# Patient Record
Sex: Female | Born: 1965
Health system: Southern US, Community
[De-identification: ages and names within clinical notes are randomized; demographics above are authoritative.]

## PROBLEM LIST (undated history)

## (undated) DIAGNOSIS — B019 Varicella without complication: Secondary | ICD-10-CM

## (undated) DIAGNOSIS — O00109 Unspecified tubal pregnancy without intrauterine pregnancy: Secondary | ICD-10-CM

## (undated) DIAGNOSIS — E079 Disorder of thyroid, unspecified: Secondary | ICD-10-CM

## (undated) DIAGNOSIS — K5909 Other constipation: Secondary | ICD-10-CM

## (undated) DIAGNOSIS — T7840XA Allergy, unspecified, initial encounter: Secondary | ICD-10-CM

## (undated) DIAGNOSIS — E042 Nontoxic multinodular goiter: Secondary | ICD-10-CM

## (undated) DIAGNOSIS — B009 Herpesviral infection, unspecified: Secondary | ICD-10-CM

## (undated) DIAGNOSIS — M359 Systemic involvement of connective tissue, unspecified: Secondary | ICD-10-CM

## (undated) DIAGNOSIS — T4145XA Adverse effect of unspecified anesthetic, initial encounter: Secondary | ICD-10-CM

## (undated) DIAGNOSIS — M1990 Unspecified osteoarthritis, unspecified site: Secondary | ICD-10-CM

## (undated) DIAGNOSIS — K635 Polyp of colon: Secondary | ICD-10-CM

## (undated) DIAGNOSIS — T8859XA Other complications of anesthesia, initial encounter: Secondary | ICD-10-CM

## (undated) DIAGNOSIS — N809 Endometriosis, unspecified: Secondary | ICD-10-CM

## (undated) DIAGNOSIS — M7500 Adhesive capsulitis of unspecified shoulder: Secondary | ICD-10-CM

## (undated) DIAGNOSIS — I341 Nonrheumatic mitral (valve) prolapse: Secondary | ICD-10-CM

## (undated) DIAGNOSIS — N6019 Diffuse cystic mastopathy of unspecified breast: Secondary | ICD-10-CM

## (undated) DIAGNOSIS — D259 Leiomyoma of uterus, unspecified: Secondary | ICD-10-CM

## (undated) DIAGNOSIS — G43909 Migraine, unspecified, not intractable, without status migrainosus: Secondary | ICD-10-CM

## (undated) DIAGNOSIS — R011 Cardiac murmur, unspecified: Secondary | ICD-10-CM

## (undated) DIAGNOSIS — N390 Urinary tract infection, site not specified: Secondary | ICD-10-CM

## (undated) DIAGNOSIS — G5600 Carpal tunnel syndrome, unspecified upper limb: Secondary | ICD-10-CM

## (undated) DIAGNOSIS — K219 Gastro-esophageal reflux disease without esophagitis: Secondary | ICD-10-CM

## (undated) HISTORY — DX: Urinary tract infection, site not specified: N39.0

## (undated) HISTORY — DX: Gastro-esophageal reflux disease without esophagitis: K21.9

## (undated) HISTORY — PX: APPENDECTOMY: SHX54

## (undated) HISTORY — DX: Varicella without complication: B01.9

## (undated) HISTORY — DX: Systemic involvement of connective tissue, unspecified: M35.9

## (undated) HISTORY — DX: Allergy, unspecified, initial encounter: T78.40XA

## (undated) HISTORY — PX: TUBAL LIGATION: SHX77

## (undated) HISTORY — DX: Endometriosis, unspecified: N80.9

## (undated) HISTORY — PX: ECTOPIC PREGNANCY SURGERY: SHX613

## (undated) HISTORY — DX: Nontoxic multinodular goiter: E04.2

## (undated) HISTORY — PX: THYROIDECTOMY: SHX17

## (undated) HISTORY — PX: TONSILLECTOMY: SUR1361

## (undated) HISTORY — PX: LAPAROSCOPIC OOPHERECTOMY: SHX6507

## (undated) HISTORY — PX: UTERINE FIBROID SURGERY: SHX826

## (undated) HISTORY — DX: Other constipation: K59.09

## (undated) HISTORY — DX: Polyp of colon: K63.5

## (undated) HISTORY — DX: Nonrheumatic mitral (valve) prolapse: I34.1

## (undated) HISTORY — DX: Carpal tunnel syndrome, unspecified upper limb: G56.00

## (undated) HISTORY — DX: Cardiac murmur, unspecified: R01.1

## (undated) HISTORY — DX: Migraine, unspecified, not intractable, without status migrainosus: G43.909

## (undated) HISTORY — DX: Diffuse cystic mastopathy of unspecified breast: N60.19

## (undated) HISTORY — DX: Leiomyoma of uterus, unspecified: D25.9

## (undated) HISTORY — DX: Adhesive capsulitis of unspecified shoulder: M75.00

## (undated) HISTORY — PX: OTHER SURGICAL HISTORY: SHX169

## (undated) HISTORY — PX: TOE SURGERY: SHX1073

## (undated) HISTORY — DX: Herpesviral infection, unspecified: B00.9

---
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
## 2007-06-07 DIAGNOSIS — K31819 Angiodysplasia of stomach and duodenum without bleeding: Secondary | ICD-10-CM | POA: Insufficient documentation

## 2007-06-07 DIAGNOSIS — D259 Leiomyoma of uterus, unspecified: Secondary | ICD-10-CM | POA: Insufficient documentation

## 2008-12-11 DIAGNOSIS — N6019 Diffuse cystic mastopathy of unspecified breast: Secondary | ICD-10-CM | POA: Insufficient documentation

## 2011-11-09 DIAGNOSIS — K2100 Gastro-esophageal reflux disease with esophagitis, without bleeding: Secondary | ICD-10-CM | POA: Insufficient documentation

## 2011-11-09 DIAGNOSIS — E890 Postprocedural hypothyroidism: Secondary | ICD-10-CM | POA: Insufficient documentation

## 2011-11-09 DIAGNOSIS — L4050 Arthropathic psoriasis, unspecified: Secondary | ICD-10-CM | POA: Insufficient documentation

## 2014-02-06 DIAGNOSIS — R002 Palpitations: Secondary | ICD-10-CM | POA: Insufficient documentation

## 2015-02-27 LAB — HM HEPATITIS C SCREENING LAB: HM HEPATITIS C SCREENING: NEGATIVE

## 2017-08-24 HISTORY — PX: ABLATION ON ENDOMETRIOSIS: SHX5787

## 2018-01-13 ENCOUNTER — Telehealth: Payer: Self-pay | Admitting: *Deleted

## 2018-01-13 NOTE — Telephone Encounter (Signed)
Copied from [place] [id number]. Topic: Inquiry >> Jan 13, 2018  1:01 PM Neva Seat wrote: New pt appt w/ Dr. Aundra Dubin July 2019. Pt is new in town and is needing her medications filled.  Pt is asking if she can have these filled.

## 2018-01-13 NOTE — Telephone Encounter (Signed)
We can not do anything until patient is established. She will need to go to an urgent care for medication.

## 2018-01-13 NOTE — Telephone Encounter (Signed)
Advised patient she need to contact PCP she has now that Dr. Aundra Dubin has no information on her at all , and cannot refill medications with out at least a background on patient and type of medications.

## 2018-01-24 ENCOUNTER — Emergency Department
Admission: EM | Admit: 2018-01-24 | Discharge: 2018-01-24 | Disposition: A | Discharge status: Home / Self Care | Payer: BLUE CROSS/BLUE SHIELD | Attending: Emergency Medicine | Admitting: Emergency Medicine

## 2018-01-24 ENCOUNTER — Encounter: Payer: Self-pay | Admitting: Emergency Medicine

## 2018-01-24 ENCOUNTER — Other Ambulatory Visit: Payer: Self-pay

## 2018-01-24 DIAGNOSIS — L02411 Cutaneous abscess of right axilla: Secondary | ICD-10-CM | POA: Diagnosis not present

## 2018-01-24 DIAGNOSIS — Z0189 Encounter for other specified special examinations: Secondary | ICD-10-CM

## 2018-01-24 DIAGNOSIS — Z79899 Other long term (current) drug therapy: Secondary | ICD-10-CM | POA: Diagnosis not present

## 2018-01-24 DIAGNOSIS — R2231 Localized swelling, mass and lump, right upper limb: Secondary | ICD-10-CM | POA: Diagnosis present

## 2018-01-24 DIAGNOSIS — Z7689 Persons encountering health services in other specified circumstances: Secondary | ICD-10-CM

## 2018-01-24 HISTORY — DX: Disorder of thyroid, unspecified: E07.9

## 2018-01-24 HISTORY — DX: Unspecified tubal pregnancy without intrauterine pregnancy: O00.109

## 2018-01-24 HISTORY — DX: Unspecified osteoarthritis, unspecified site: M19.90

## 2018-01-24 MED ORDER — LIDOCAINE HCL (PF) 1 % IJ SOLN
5.0000 mL | Freq: Once | INTRAMUSCULAR | Status: AC
  Administered 2018-01-24: 5 mL

## 2018-01-24 MED ORDER — DOXYCYCLINE HYCLATE 100 MG PO TABS
100.0000 mg | ORAL_TABLET | Freq: Once | ORAL | Status: AC
  Administered 2018-01-24: 100 mg via ORAL
  Filled 2018-01-24: qty 1

## 2018-01-24 MED ORDER — DOXYCYCLINE HYCLATE 100 MG PO TABS: 100.0000 mg | ORAL_TABLET | Freq: Two times a day (BID) | ORAL | 0 refills | Status: DC

## 2018-01-24 MED ORDER — HYDROCODONE-ACETAMINOPHEN 5-325 MG PO TABS: 1.0000 | ORAL_TABLET | Freq: Two times a day (BID) | ORAL | 0 refills | Status: AC | PRN

## 2018-01-24 MED ORDER — LIDOCAINE HCL (PF) 1 % IJ SOLN
INTRAMUSCULAR | Status: AC
  Filled 2018-01-24: qty 5

## 2018-01-24 MED ORDER — IBUPROFEN 800 MG PO TABS
800.0000 mg | ORAL_TABLET | Freq: Once | ORAL | Status: AC
  Administered 2018-01-24: 800 mg via ORAL
  Filled 2018-01-24: qty 1

## 2018-01-24 MED ORDER — LIDOCAINE HCL (PF) 1 % IJ SOLN
5.0000 mL | Freq: Once | INTRAMUSCULAR | Status: AC
  Administered 2018-01-24: 5 mL
  Filled 2018-01-24: qty 5

## 2018-01-24 NOTE — Discharge Instructions (Signed)
Keep the wound clean, dry, and covered. Apply warm compresses over the dressing to promote healing. Follow-up in 3 days for wound check and packing removal. Take the antibiotic as directed and the pain medicine as needed.

## 2018-01-24 NOTE — ED Triage Notes (Signed)
Patient states she has a "boil" under her right arm pit going on 2 weeks, worsening over past couple of days.  Denies hx of same.

## 2018-01-24 NOTE — ED Provider Notes (Signed)
[facility] Emergency Department Provider Note ____________________________________________  Time seen: 1015  I have reviewed the triage vital signs and the nursing notes.  HISTORY  Chief Complaint  Abscess  History as told to C. Amedeo Plenty, PA-S [facility]).  HPI Courtney Ward is a 52 y.o. female presents to the ED for evaluation of a 2-week complaint of worsening boil to the right armpit.  Patient describes her last 2 days however, the pain is increased significantly.  She denies any spontaneous drainage, or history of the same.  She has been applying warm compresses to the area.  She presents now with a firm area of swelling to the right armpit.  She denies any fevers, chills, or sweats.  Past Medical History:  Diagnosis Date  . Arthritis   . Thyroid disease   . Tubal ectopic pregnancy     There are no active problems to display for this patient.   Past Surgical History:  Procedure Laterality Date  . CESAREAN SECTION    . LAPAROSCOPIC OOPHERECTOMY    . THYROIDECTOMY    . TONSILLECTOMY      Prior to Admission medications   Medication Sig Start Date End Date Taking? Authorizing Provider  cetirizine-pseudoephedrine (ZYRTEC-D) 5-120 MG tablet Take 1 tablet by mouth 2 (two) times daily.   Yes [provider]  famotidine (PEPCID AC) 10 MG chewable tablet Chew 10 mg by mouth 2 (two) times daily.   Yes [provider]  levothyroxine (SYNTHROID, LEVOTHROID) 50 MCG tablet Take 50 mcg by mouth daily before breakfast.   Yes [provider]  doxycycline (VIBRA-TABS) 100 MG tablet Take 1 tablet (100 mg total) by mouth 2 (two) times daily. 01/24/18   Saima Monterroso, Dannielle Karvonen, PA-C  HYDROcodone-acetaminophen (NORCO) 5-325 MG tablet Take 1 tablet by mouth 2 (two) times daily as needed for up to 3 days. 01/24/18 01/27/18  Braxston Quinter, Dannielle Karvonen, PA-C    Allergies Sulfa antibiotics; Tramadol; and Codeine  No family history on file.  Social  History Social History   Tobacco Use  . Smoking status: Never Smoker  . Smokeless tobacco: Never Used  Substance Use Topics  . Alcohol use: Not on file  . Drug use: Not on file    Review of Systems  Constitutional: Negative for fever. Eyes: Negative for visual changes. ENT: Negative for sore throat. Cardiovascular: Negative for chest pain. Respiratory: Negative for shortness of breath. Musculoskeletal: Negative for back pain. Skin: Negative for rash. Right axilla abscess.  Neurological: Negative for headaches, focal weakness or numbness. ____________________________________________  PHYSICAL EXAM:  VITAL SIGNS: ED Triage Vitals  Enc Vitals Group     BP 01/24/18 0901 128/83     Pulse Rate 01/24/18 0901 76     Resp 01/24/18 0901 16     Temp 01/24/18 0901 98.3 F (36.8 C)     Temp Source 01/24/18 0901 Oral     SpO2 01/24/18 0901 100 %     Weight 01/24/18 0904 165 lb (74.8 kg)     Height 01/24/18 0904 5\' 4"  (1.626 m)     Head Circumference --      Peak Flow --      Pain Score 01/24/18 0903 8     Pain Loc --      Pain Edu? --      Excl. in [place]? --     Constitutional: Alert and oriented. Well appearing and in no distress. Head: Normocephalic and atraumatic. Eyes: Conjunctivae are normal. Normal extraocular  movements Cardiovascular: Normal rate, regular rhythm. Normal distal pulses. Respiratory: Normal respiratory effort. No wheezes/rales/rhonchi. Musculoskeletal: Nontender with normal range of motion in all extremities.  Neurologic:  Normal gait without ataxia. Normal speech and language. No gross focal neurologic deficits are appreciated. Skin:  Skin is warm, dry and intact. No rash noted.  Right axilla with a firm, pointing abscess formation.  There is no spontaneous drainage or surrounding erythema. ____________________________________________  PROCEDURES  Doxycycline 100 mg PO IBU 800 mg PO  .Marland KitchenIncision and Drainage Date/Time: 01/25/2018 7:02 PM Performed by:  Jeralyn Ruths, Student-PA Authorized by: Melvenia Needles, PA-C   Consent:    Consent obtained:  Verbal   Consent given by:  Patient   Risks discussed:  Incomplete drainage, bleeding and pain Location:    Type:  Abscess   Location:  Upper extremity (right axilla) Pre-procedure details:    Skin preparation:  Betadine Anesthesia (see MAR for exact dosages):    Anesthesia method:  Local infiltration   Local anesthetic:  Lidocaine 1% w/o epi Procedure type:    Complexity:  Simple Procedure details:    Needle aspiration: no     Incision types:  Single straight   Incision depth:  Subcutaneous   Scalpel blade:  11   Wound management:  Probed and deloculated and irrigated with saline   Drainage:  Purulent   Drainage amount:  Moderate   Wound treatment:  Drain placed   Packing materials:  1/4 in iodoform gauze   Amount 1/4" iodoform:  4 cm Post-procedure details:    Patient tolerance of procedure:  Tolerated well, no immediate complications  ____________________________________________  INITIAL IMPRESSION / ASSESSMENT AND PLAN / ED COURSE  Management of a right axilla abscess, status post I&D procedure.  Tolerates procedure well and a moderate amount of purulent material is expressed from the abscess.  Wound is appropriately flushed, packed, and dressed.  Patient is discharged with prescription for doxycycline as well as hydrocodone to take as needed.  She will return to the ED in 3 days for wound check and packing removal.  Work note is provided as requested. ____________________________________________  FINAL CLINICAL IMPRESSION(S) / ED DIAGNOSES  Final diagnoses:  Abscess of axilla, right  Encounter for incision and drainage procedure      Melvenia Needles, PA-C 01/25/18 1905    Lavonia Drafts, MD 01/27/18 1801

## 2018-01-26 ENCOUNTER — Other Ambulatory Visit: Payer: Self-pay

## 2018-01-26 ENCOUNTER — Emergency Department
Admission: EM | Admit: 2018-01-26 | Discharge: 2018-01-26 | Disposition: A | Discharge status: Home / Self Care | Payer: BLUE CROSS/BLUE SHIELD | Attending: Emergency Medicine | Admitting: Emergency Medicine

## 2018-01-26 ENCOUNTER — Encounter: Payer: Self-pay | Admitting: Emergency Medicine

## 2018-01-26 DIAGNOSIS — Z79899 Other long term (current) drug therapy: Secondary | ICD-10-CM | POA: Insufficient documentation

## 2018-01-26 DIAGNOSIS — Z09 Encounter for follow-up examination after completed treatment for conditions other than malignant neoplasm: Secondary | ICD-10-CM

## 2018-01-26 NOTE — Discharge Instructions (Addendum)
Keep the wound clean, dry, and covered. Apply warm compresses to promote healing. Continue to dose the antibiotic as prescribed. Follow-up with your primary provider or Dermatology as discussed.

## 2018-01-26 NOTE — ED Notes (Signed)
Says here fro recheck of wound under left arm.  Dressing in place.  Says it iseems like it is bigger than before.  In nad.

## 2018-01-26 NOTE — ED Triage Notes (Signed)
Had abscess lanced on Monday-here for recheck.

## 2018-01-26 NOTE — ED Provider Notes (Signed)
[facility] Emergency Department Provider Note ____________________________________________  Time seen: 1012  I have reviewed the triage vital signs and the nursing notes.  HISTORY  Chief Complaint  Wound Check  HPI Courtney Ward is a 52 y.o. female returns to the ED for wound check and packing removal.  Patient denies any interim complaints and she has been tolerating the antibiotic as prescribed.  She is concerned that she had some adverse effects from the pain medicine as suspected.  She is not taking any pain medicine in more than 24 hours.  Reports improvement of the pain and tenderness to her right axilla abscess.  Past Medical History:  Diagnosis Date  . Arthritis   . Thyroid disease   . Tubal ectopic pregnancy     There are no active problems to display for this patient.  Past Surgical History:  Procedure Laterality Date  . CESAREAN SECTION    . LAPAROSCOPIC OOPHERECTOMY    . THYROIDECTOMY    . TONSILLECTOMY      Prior to Admission medications   Medication Sig Start Date End Date Taking? Authorizing Provider  cetirizine-pseudoephedrine (ZYRTEC-D) 5-120 MG tablet Take 1 tablet by mouth 2 (two) times daily.    [provider]  doxycycline (VIBRA-TABS) 100 MG tablet Take 1 tablet (100 mg total) by mouth 2 (two) times daily. 01/24/18   Anina Schnake, Dannielle Karvonen, PA-C  famotidine (PEPCID AC) 10 MG chewable tablet Chew 10 mg by mouth 2 (two) times daily.    [provider]  HYDROcodone-acetaminophen (NORCO) 5-325 MG tablet Take 1 tablet by mouth 2 (two) times daily as needed for up to 3 days. 01/24/18 01/27/18  Taronda Comacho, Dannielle Karvonen, PA-C  levothyroxine (SYNTHROID, LEVOTHROID) 50 MCG tablet Take 50 mcg by mouth daily before breakfast.    [provider]    Allergies Sulfa antibiotics; Tramadol; and Codeine  No family history on file.  Social History Social History   Tobacco Use  . Smoking status: Never Smoker  .  Smokeless tobacco: Never Used  Substance Use Topics  . Alcohol use: Not on file  . Drug use: Not on file    Review of Systems  Constitutional: Negative for fever. Cardiovascular: Negative for chest pain. Respiratory: Negative for shortness of breath. Musculoskeletal: Negative for back pain. Skin: Negative for rash.  Healing right axilla abscess as above. Neurological: Negative for headaches, focal weakness or numbness. ____________________________________________  PHYSICAL EXAM:  VITAL SIGNS: ED Triage Vitals  Enc Vitals Group     BP 01/26/18 0954 (!) 152/78     Pulse Rate 01/26/18 0954 76     Resp 01/26/18 0954 18     Temp 01/26/18 0954 98.6 F (37 C)     Temp Source 01/26/18 0954 Oral     SpO2 01/26/18 0954 98 %     Weight 01/26/18 0954 165 lb (74.8 kg)     Height 01/26/18 0954 5\' 4"  (1.626 m)     Head Circumference --      Peak Flow --      Pain Score 01/26/18 1000 5     Pain Loc --      Pain Edu? --      Excl. in [place]? --     Constitutional: Alert and oriented. Well appearing and in no distress. Head: Normocephalic and atraumatic. Cardiovascular: Normal rate, regular rhythm. Normal distal pulses. Respiratory: Normal respiratory effort.  Musculoskeletal: Nontender with normal range of motion in all extremities.  Neurologic:  Normal  gait without ataxia. Normal speech and language. No gross focal neurologic deficits are appreciated. Skin:  Skin is warm, dry and intact. No rash noted.  A well-healing right axilla abscess is noted.  The wound packing is removed.  The wound flushes clear after saline flush.  There remains some induration inferiorly to the incision site. ____________________________________________  PROCEDURES  Procedures  Wet-to-dry 2 x 2 gauze dressing Paper tape applied ____________________________________________  INITIAL IMPRESSION / ASSESSMENT AND PLAN / ED COURSE  Patient with ED evaluation wound check of a right axilla abscess.  Wound  appears to be healing well.  Patient has the packing removed and a dressing is applied.  She is encouraged to continue antibiotics as prescribed.  She is also encouraged to apply warm compresses to promote healing.  A referral for dermatology is also provided for interim wound check.  A work note is provided for today as requested. ____________________________________________  FINAL CLINICAL IMPRESSION(S) / ED DIAGNOSES  Final diagnoses:  Encounter for recheck of abscess following incision and drainage      Janaisha Tolsma, Dannielle Karvonen, PA-C 01/26/18 [place]    Earleen Newport, MD 01/26/18 1157

## 2018-03-08 ENCOUNTER — Ambulatory Visit: Payer: BLUE CROSS/BLUE SHIELD | Admitting: Internal Medicine

## 2018-03-08 ENCOUNTER — Other Ambulatory Visit (HOSPITAL_COMMUNITY)
Admission: RE | Admit: 2018-03-08 | Discharge: 2018-03-08 | Disposition: A | Discharge status: Home / Self Care | Payer: BLUE CROSS/BLUE SHIELD | Source: Ambulatory Visit | Attending: Internal Medicine | Admitting: Internal Medicine

## 2018-03-08 ENCOUNTER — Other Ambulatory Visit: Payer: Self-pay | Admitting: Internal Medicine

## 2018-03-08 VITALS — BP 124/58 | HR 71 | Temp 98.5°F | Ht 64.0 in | Wt 163.0 lb

## 2018-03-08 DIAGNOSIS — E611 Iron deficiency: Secondary | ICD-10-CM | POA: Insufficient documentation

## 2018-03-08 DIAGNOSIS — K219 Gastro-esophageal reflux disease without esophagitis: Secondary | ICD-10-CM

## 2018-03-08 DIAGNOSIS — Z0184 Encounter for antibody response examination: Secondary | ICD-10-CM

## 2018-03-08 DIAGNOSIS — I341 Nonrheumatic mitral (valve) prolapse: Secondary | ICD-10-CM | POA: Insufficient documentation

## 2018-03-08 DIAGNOSIS — R7303 Prediabetes: Secondary | ICD-10-CM

## 2018-03-08 DIAGNOSIS — Z1159 Encounter for screening for other viral diseases: Secondary | ICD-10-CM

## 2018-03-08 DIAGNOSIS — E890 Postprocedural hypothyroidism: Secondary | ICD-10-CM | POA: Insufficient documentation

## 2018-03-08 DIAGNOSIS — E039 Hypothyroidism, unspecified: Secondary | ICD-10-CM | POA: Insufficient documentation

## 2018-03-08 DIAGNOSIS — R2231 Localized swelling, mass and lump, right upper limb: Secondary | ICD-10-CM

## 2018-03-08 DIAGNOSIS — Z113 Encounter for screening for infections with a predominantly sexual mode of transmission: Secondary | ICD-10-CM | POA: Diagnosis not present

## 2018-03-08 DIAGNOSIS — Z8669 Personal history of other diseases of the nervous system and sense organs: Secondary | ICD-10-CM | POA: Insufficient documentation

## 2018-03-08 DIAGNOSIS — R739 Hyperglycemia, unspecified: Secondary | ICD-10-CM

## 2018-03-08 DIAGNOSIS — G5601 Carpal tunnel syndrome, right upper limb: Secondary | ICD-10-CM

## 2018-03-08 DIAGNOSIS — Z0000 Encounter for general adult medical examination without abnormal findings: Secondary | ICD-10-CM

## 2018-03-08 DIAGNOSIS — M351 Other overlap syndromes: Secondary | ICD-10-CM

## 2018-03-08 DIAGNOSIS — B009 Herpesviral infection, unspecified: Secondary | ICD-10-CM

## 2018-03-08 DIAGNOSIS — E559 Vitamin D deficiency, unspecified: Secondary | ICD-10-CM | POA: Insufficient documentation

## 2018-03-08 DIAGNOSIS — Z1389 Encounter for screening for other disorder: Secondary | ICD-10-CM

## 2018-03-08 DIAGNOSIS — M1990 Unspecified osteoarthritis, unspecified site: Secondary | ICD-10-CM

## 2018-03-08 DIAGNOSIS — Z135 Encounter for screening for eye and ear disorders: Secondary | ICD-10-CM

## 2018-03-08 MED ORDER — VALACYCLOVIR HCL 500 MG PO TABS: 500.0000 mg | ORAL_TABLET | Freq: Every day | ORAL | 3 refills | Status: DC

## 2018-03-08 NOTE — Progress Notes (Addendum)
Chief Complaint  Patient presents with  . Establish Care   New patient  1. c/o right CTS pain  2. H/o migraines no current h/o  3. H/o MCTD lupus vs RA needs referral rheumatology on plaquenil  4. H/o post op hypothyroidism on levothyroxine and T3 will check labs today needs referral endocrine  5. Wants work form filled out for labcopr  6. C/o right axilla cyst not resolving since 01/2018 and took doxycycline w/o resolution  7. H/o HSV would like refill of valtrex   Review of Systems  Constitutional: Negative for weight loss.  HENT: Negative for hearing loss.   Eyes: Negative for blurred vision.  Respiratory: Negative for shortness of breath.   Cardiovascular: Negative for chest pain.  Gastrointestinal: Negative for abdominal pain.  Musculoskeletal:       +right CTS  Skin: Negative for rash.  Neurological: Negative for headaches.  Endo/Heme/Allergies:       Right axilla lesion ? Lymph node vs cyst   Psychiatric/Behavioral: Negative for depression.   Past Medical History:  Diagnosis Date  . Arthritis   . CTS (carpal tunnel syndrome)    right  . Thyroid disease   . Tubal ectopic pregnancy    Past Surgical History:  Procedure Laterality Date  . CESAREAN SECTION    . LAPAROSCOPIC OOPHERECTOMY    . THYROIDECTOMY    . TONSILLECTOMY     Family History  Problem Relation Age of Onset  . Arthritis Mother   . Depression Mother   . Diabetes Mother   . Heart disease Mother   . Hyperlipidemia Mother   . Hypertension Mother   . Kidney disease Mother   . Stroke Mother   . Miscarriages / [place] Mother   . Arthritis Father   . Diabetes Father   . Heart disease Father   . Hyperlipidemia Father   . Hypertension Father   . Arthritis Sister   . Asthma Sister   . Depression Sister   . Drug abuse Sister   . Hyperlipidemia Sister   . Hypertension Sister   . Learning disabilities Sister   . Mental illness Sister   . Alcohol abuse Brother   . Arthritis Brother   . COPD  Brother   . Depression Brother   . Drug abuse Brother   . Mental illness Brother   . Arthritis Daughter   . Depression Daughter   . Hypertension Daughter   . Asthma Son   . Arthritis Maternal Grandmother   . Asthma Maternal Grandmother   . Cancer Maternal Grandmother        bone  . Depression Maternal Grandmother   . Drug abuse Maternal Grandmother   . Heart disease Maternal Grandmother   . Hyperlipidemia Maternal Grandmother   . Hypertension Maternal Grandmother   . Miscarriages / Stillbirths Maternal Grandmother   . Alcohol abuse Maternal Grandfather   . Arthritis Maternal Grandfather   . Cancer Maternal Grandfather        prostate  . COPD Maternal Grandfather   . COPD Paternal Grandmother   . Hyperlipidemia Paternal Grandmother   . Hypertension Paternal Grandmother   . Miscarriages / Stillbirths Paternal Grandmother   . Alcohol abuse Brother   . Arthritis Brother   . Depression Brother   . Diabetes Brother   . Drug abuse Brother   . Hyperlipidemia Brother   . Hypertension Brother   . Learning disabilities Brother   . Mental illness Brother   . Alcohol abuse Brother   .  Arthritis Brother   . COPD Brother   . Depression Brother   . Diabetes Brother   . Drug abuse Brother   . Hyperlipidemia Brother   . Hypertension Brother    Social History   Socioeconomic History  . Marital status: Divorced    Spouse name: Not on file  . Number of children: Not on file  . Years of education: Not on file  . Highest education level: Not on file  Occupational History  . Not on file  Social Needs  . Financial resource strain: Not on file  . Food insecurity:    Worry: Not on file    Inability: Not on file  . Transportation needs:    Medical: Not on file    Non-medical: Not on file  Tobacco Use  . Smoking status: Never Smoker  . Smokeless tobacco: Never Used  Substance and Sexual Activity  . Alcohol use: Not on file  . Drug use: Not on file  . Sexual activity: Not on  file  Lifestyle  . Physical activity:    Days per week: Not on file    Minutes per session: Not on file  . Stress: Not on file  Relationships  . Social connections:    Talks on phone: Not on file    Gets together: Not on file    Attends religious service: Not on file    Active member of club or organization: Not on file    Attends meetings of clubs or organizations: Not on file    Relationship status: Not on file  . Intimate partner violence:    Fear of current or ex partner: Not on file    Emotionally abused: Not on file    Physically abused: Not on file    Forced sexual activity: Not on file  Other Topics Concern  . Not on file  Social History Narrative  . Not on file   Current Meds  Medication Sig  . Calcium Carbonate (CALCIUM-CARB 600 PO) Take 2 tablets by mouth daily.  . Cyanocobalamin 1000 MCG CAPS Take 2 capsules by mouth daily.   . famotidine (PEPCID) 40 MG tablet Take 40 mg by mouth 2 (two) times daily.   . hydroxychloroquine (PLAQUENIL) 200 MG tablet Take 200 mg by mouth 2 (two) times daily.   Marland Kitchen levothyroxine (SYNTHROID, LEVOTHROID) 100 MCG tablet Take by mouth.  . liothyronine (CYTOMEL) 25 MCG tablet 1/2 daily.  . [DISCONTINUED] Calcium Carb-Cholecalciferol (CALCIUM CARBONATE-VITAMIN D3 PO) Take by mouth.  . [DISCONTINUED] VALACYCLOVIR HCL PO Take 1,000 mg by mouth daily.   Allergies  Allergen Reactions  . Sulfa Antibiotics Hives  . Tramadol Other (See Comments)    Dizzy and confused  . Codeine Rash   Recent Results (from the past 2160 hour(s))  TSH     Status: Abnormal   Collection Time: 03/08/18  9:56 AM  Result Value Ref Range   TSH 0.041 (L) 0.450 - 4.500 uIU/mL  CBC with Differential/Platelet     Status: None   Collection Time: 03/08/18  9:56 AM  Result Value Ref Range   WBC 5.5 3.4 - 10.8 x10E3/uL   RBC 4.25 3.77 - 5.28 x10E6/uL   Hemoglobin 13.2 11.1 - 15.9 g/dL   Hematocrit 39.6 34.0 - 46.6 %   MCV 93 79 - 97 fL   MCH 31.1 26.6 - 33.0 pg    MCHC 33.3 31.5 - 35.7 g/dL   RDW 14.1 12.3 - 15.4 %   Platelets 292 150 -  450 x10E3/uL   Neutrophils 59 Not Estab. %   Lymphs 31 Not Estab. %   Monocytes 8 Not Estab. %   Eos 2 Not Estab. %   Basos 0 Not Estab. %   Neutrophils Absolute 3.3 1.4 - 7.0 x10E3/uL   Lymphocytes Absolute 1.7 0.7 - 3.1 x10E3/uL   Monocytes Absolute 0.4 0.1 - 0.9 x10E3/uL   EOS (ABSOLUTE) 0.1 0.0 - 0.4 x10E3/uL   Basophils Absolute 0.0 0.0 - 0.2 x10E3/uL   Immature Granulocytes 0 Not Estab. %   Immature Grans (Abs) 0.0 0.0 - 0.1 x10E3/uL  Comprehensive metabolic panel     Status: Abnormal   Collection Time: 03/08/18  9:56 AM  Result Value Ref Range   Glucose 89 65 - 99 mg/dL   BUN 8 6 - 24 mg/dL   Creatinine, Ser 0.91 0.57 - 1.00 mg/dL   GFR calc non Af Amer 72 >59 mL/min/1.73   GFR calc Af Amer 83 >59 mL/min/1.73   BUN/Creatinine Ratio 9 9 - 23   Sodium 145 (H) 134 - 144 mmol/L   Potassium 4.1 3.5 - 5.2 mmol/L   Chloride 105 96 - 106 mmol/L   CO2 25 20 - 29 mmol/L   Calcium 9.9 8.7 - 10.2 mg/dL   Total Protein 7.0 6.0 - 8.5 g/dL   Albumin 4.8 3.5 - 5.5 g/dL   Globulin, Total 2.2 1.5 - 4.5 g/dL   Albumin/Globulin Ratio 2.2 1.2 - 2.2   Bilirubin Total 0.4 0.0 - 1.2 mg/dL   Alkaline Phosphatase 65 39 - 117 IU/L   AST 15 0 - 40 IU/L   ALT 20 0 - 32 IU/L  Iron, TIBC and Ferritin Panel     Status: None   Collection Time: 03/08/18  9:56 AM  Result Value Ref Range   Total Iron Binding Capacity 352 250 - 450 ug/dL   UIBC 282 131 - 425 ug/dL   Iron 70 27 - 159 ug/dL   Iron Saturation 20 15 - 55 %   Ferritin 31 15 - 150 ng/mL  Urinalysis, Routine w reflex microscopic     Status: Abnormal   Collection Time: 03/08/18  9:56 AM  Result Value Ref Range   Specific Gravity, UA 1.020 1.005 - 1.030   pH, UA 6.0 5.0 - 7.5   Color, UA Yellow Yellow   Appearance Ur Cloudy (A) Clear   Leukocytes, UA Negative Negative   Protein, UA Trace Negative/Trace   Glucose, UA Negative Negative   Ketones, UA Negative  Negative   RBC, UA Negative Negative   Bilirubin, UA Negative Negative   Urobilinogen, Ur 0.2 0.2 - 1.0 mg/dL   Nitrite, UA Negative Negative   Microscopic Examination Comment     Comment: Microscopic not indicated and not performed.  Hemoglobin A1c     Status: Abnormal   Collection Time: 03/08/18  9:56 AM  Result Value Ref Range   Hgb A1c MFr Bld 5.8 (H) 4.8 - 5.6 %    Comment:          Prediabetes: 5.7 - 6.4          Diabetes: >6.4          Glycemic control for adults with diabetes: <7.0    Est. average glucose Bld gHb Est-mCnc 120 mg/dL  VITAMIN D 25 Hydroxy (Vit-D Deficiency, Fractures)     Status: None   Collection Time: 03/08/18  9:56 AM  Result Value Ref Range   Vit D, 25-Hydroxy 31.0 30.0 -  100.0 ng/mL    Comment: Vitamin D deficiency has been defined by the [place] practice guideline as a level of serum 25-OH vitamin D less than 20 ng/mL (1,2). The Endocrine Society went on to further define vitamin D insufficiency as a level between 21 and 29 ng/mL (2). 1. IOM (Institute of Medicine). 2010. Dietary reference    intakes for calcium and D. [place]: The    [employer]. 2. Holick MF, Binkley , Bischoff-Ferrari HA, et al.    Evaluation, treatment, and prevention of vitamin D    deficiency: an Endocrine Society clinical practice    guideline. JCEM. 2011 Jul; 96(7):1911-30.   RPR     Status: None   Collection Time: 03/08/18  9:56 AM  Result Value Ref Range   RPR Ser Ql Non Reactive Non Reactive  HSV 1 antibody, IgG     Status: Abnormal   Collection Time: 03/08/18  9:56 AM  Result Value Ref Range   HSV 1 Glycoprotein G Ab, IgG >62.20 (H) 0.00 - 0.90 index    Comment:                                  Negative        <0.91                                  Equivocal 0.91 - 1.09                                  Positive        >1.09  Note: Negative indicates no antibodies detected to  HSV-1. Equivocal may suggest  early infection.  If  clinically appropriate, retest at later date. Positive  indicates antibodies detected to HSV-1.   HSV 2 antibody, IgG     Status: Abnormal   Collection Time: 03/08/18  9:56 AM  Result Value Ref Range   HSV 2 IgG, Type Spec >23.60 (H) 0.00 - 0.90 index    Comment:                                  Negative        <0.91                                  Equivocal 0.91 - 1.09                                  Positive        >1.09  Note: Negative indicates no antibodies detected to  HSV-2. Equivocal may suggest early infection.  If  clinically appropriate, retest at later date. Positive  indicates antibodies detected to HSV-2.   HIV antibody (with reflex)     Status: None   Collection Time: 03/08/18  9:56 AM  Result Value Ref Range   HIV Screen 4th Generation wRfx Non Reactive Non Reactive  Hepatitis C antibody     Status: None   Collection Time: 03/08/18  9:56 AM  Result Value Ref Range   Hep C  Virus Ab <0.1 0.0 - 0.9 s/co ratio    Comment:                                   Negative:     < 0.8                              Indeterminate: 0.8 - 0.9                                   Positive:     > 0.9  The CDC recommends that a positive HCV antibody result  be followed up with a HCV Nucleic Acid Amplification  test (086761).    Objective  Body mass index is 27.98 kg/m. Wt Readings from Last 3 Encounters:  03/08/18 163 lb (73.9 kg)  01/26/18 165 lb (74.8 kg)  01/24/18 165 lb (74.8 kg)   Temp Readings from Last 3 Encounters:  03/08/18 98.5 F (36.9 C) (Oral)  01/26/18 98.6 F (37 C) (Oral)  01/24/18 98.3 F (36.8 C) (Oral)   BP Readings from Last 3 Encounters:  03/08/18 (!) 124/58  01/26/18 (!) 152/78  01/24/18 125/62   Pulse Readings from Last 3 Encounters:  03/08/18 71  01/26/18 76  01/24/18 61    Physical Exam  Constitutional: She is oriented to person, place, and time. Vital signs are normal. She appears well-developed and well-nourished.  She is cooperative.  HENT:  Head: Normocephalic and atraumatic.  Mouth/Throat: Oropharynx is clear and moist and mucous membranes are normal.  Eyes: Pupils are equal, round, and reactive to light. Conjunctivae are normal.  Cardiovascular: Normal rate and regular rhythm.  Murmur heard. Pulmonary/Chest: Effort normal and breath sounds normal.  Lymphadenopathy:    She has axillary adenopathy.  Neurological: She is alert and oriented to person, place, and time. Gait normal.  Skin: Skin is warm, dry and intact.  Psychiatric: She has a normal mood and affect. Her speech is normal and behavior is normal. Judgment and thought content normal. Cognition and memory are normal.  Nursing note and vitals reviewed.   Assessment   1. Hypothyroidism s/p thyroidectomy for goiter enlarging and bx inconclusive so thyroid removed w/o cancer.  2. Right axillary lymph node vs cyst 1 cm  3. CTS, right  4. H/o MVP with cardiac murmur  5. H/o migraines stable aura with kaleidoscope prior to migraine tried imitrex in the past eye exam had 8 or 6. 04/2017  6. H/o MCTD lupus vs arthritis  7. HM Plan   1. [facility]er to [facility] endocrine check labs today Cont levo 100 mcg qd on cytomel 12.5 mg qd since 09/2015  2. [place] right axilla  She was tx'ed doxycycline in 01/2018 for cyst but not resolved.  Consider Dr. Bary Castilla based on [place] report  3.monitor  4. Get echo prior PCP  H/o MVP  5. Monitor  6. Refer Dr. Ranee Gosselin rheumatology  Refer to [facility] for monitoring on plauquenil yearly  7.  Flu shot had 2018  Check Date of Tdap prior PCP Check MMR status consider hep B titer in future had 2/3 shots pending 3rd, check hep C   Pap 02/2017 need to get records prior PCP -had pap 03/13/11 negative HPV negative  -pap 01/14/12 neg pap neg HPV -had pap  02/27/15 negative no comment on HPV   mammo 04/2017 need to get records prior PCP -mammo 04/12/10 negative  -mammo 04/29/11 normal  -mammo 03/30/13 normal  -mammo 04/02/14 neg [facility] [phone]  Colonoscopy had 2014 h/o colon polyps prior PCP  Check Urine and blood STD [facility] Former smoker age 51/14 4 cig qd quit age 61 y.o  Hep C neg 02/27/15 H/o fibroids noted 04/29/11 pelvic US and right ovary cyst  MRI brain 09/01/10 negative  Xray lumbar 08/04/10 left femoral head osteophyte no joint space narrowing    Eye MD [facility] Dentist Dr. Charlann Noss  Endocrinologist Dr. Anne Hahn with [facility] in [place]  PCP Roel Cluck in [place] last seen 02/25/17 [phone] [facility] MD requested and obtained  - Rheumatologist Dr. Bertell [facility] 03/30/18 no toxic maculopathy for medication f/u in 1 year Dr. Thomasene Ripple   Prior Xrays mild OA changes in hands and mild degenerative changes to knees b/l  CT sinuses 08/19/12 negative except partial opacification right posterior ethmoid air cells stables since 2006    Of note no recent mammo or echo in results requested also no vaccines listed or colonoscopy.  "I spent 60 minutes face-to face with patient with greater than 50% of time spent counseling and/or in coordination of care referrals, further w/u with labs and [place] as above and medication refills. Filled out [facility] work form   Saw [facility] 03/30/18 Dr. Thomasene Ripple   Provider: Dr. Olivia Mackie McLean-Scocuz[facility]

## 2018-03-08 NOTE — Patient Instructions (Addendum)
We will do an ultrasound under your right arm at [facility] [facility]  Take care and we will see you within 3 months for physical

## 2018-03-08 NOTE — Progress Notes (Signed)
Pre visit review using our clinic review tool, if applicable. No additional management support is needed unless otherwise documented below in the visit note. 

## 2018-03-09 LAB — COMPREHENSIVE METABOLIC PANEL
ALT: 20 IU/L (ref 0–32)
AST: 15 IU/L (ref 0–40)
Albumin/Globulin Ratio: 2.2 (ref 1.2–2.2)
Albumin: 4.8 g/dL (ref 3.5–5.5)
Alkaline Phosphatase: 65 IU/L (ref 39–117)
BUN / CREAT RATIO: 9 (ref 9–23)
BUN: 8 mg/dL (ref 6–24)
Bilirubin Total: 0.4 mg/dL (ref 0.0–1.2)
CALCIUM: 9.9 mg/dL (ref 8.7–10.2)
CO2: 25 mmol/L (ref 20–29)
Chloride: 105 mmol/L (ref 96–106)
Creatinine, Ser: 0.91 mg/dL (ref 0.57–1.00)
GFR calc Af Amer: 83 mL/min/{1.73_m2} (ref >59)
GFR calc non Af Amer: 72 mL/min/{1.73_m2} (ref >59)
GLUCOSE: 89 mg/dL (ref 65–99)
Globulin, Total: 2.2 g/dL (ref 1.5–4.5)
Potassium: 4.1 mmol/L (ref 3.5–5.2)
SODIUM: 145 mmol/L — AB (ref 134–144)
TOTAL PROTEIN: 7 g/dL (ref 6.0–8.5)

## 2018-03-09 LAB — IRON,TIBC AND FERRITIN PANEL
FERRITIN: 31 ng/mL (ref 15–150)
Iron Saturation: 20 % (ref 15–55)
Iron: 70 ug/dL (ref 27–159)
TIBC: 352 ug/dL (ref 250–450)
UIBC: 282 ug/dL (ref 131–425)

## 2018-03-09 LAB — URINALYSIS, ROUTINE W REFLEX MICROSCOPIC
Bilirubin, UA: NEGATIVE
GLUCOSE, UA: NEGATIVE
Ketones, UA: NEGATIVE
Leukocytes, UA: NEGATIVE
NITRITE UA: NEGATIVE
RBC UA: NEGATIVE
Specific Gravity, UA: 1.02 (ref 1.005–1.030)
UUROB: 0.2 mg/dL (ref 0.2–1.0)
pH, UA: 6 (ref 5.0–7.5)

## 2018-03-09 LAB — CBC WITH DIFFERENTIAL/PLATELET
BASOS ABS: 0 10*3/uL (ref 0.0–0.2)
BASOS: 0 %
EOS (ABSOLUTE): 0.1 10*3/uL (ref 0.0–0.4)
Eos: 2 %
Hematocrit: 39.6 % (ref 34.0–46.6)
Hemoglobin: 13.2 g/dL (ref 11.1–15.9)
IMMATURE GRANS (ABS): 0 10*3/uL (ref 0.0–0.1)
IMMATURE GRANULOCYTES: 0 %
LYMPHS: 31 %
Lymphocytes Absolute: 1.7 10*3/uL (ref 0.7–3.1)
MCH: 31.1 pg (ref 26.6–33.0)
MCHC: 33.3 g/dL (ref 31.5–35.7)
MCV: 93 fL (ref 79–97)
Monocytes Absolute: 0.4 10*3/uL (ref 0.1–0.9)
Monocytes: 8 %
NEUTROS PCT: 59 %
Neutrophils Absolute: 3.3 10*3/uL (ref 1.4–7.0)
PLATELETS: 292 10*3/uL (ref 150–450)
RBC: 4.25 x10E6/uL (ref 3.77–5.28)
RDW: 14.1 % (ref 12.3–15.4)
WBC: 5.5 10*3/uL (ref 3.4–10.8)

## 2018-03-09 LAB — TSH: TSH: 0.041 u[IU]/mL — AB (ref 0.450–4.500)

## 2018-03-09 LAB — HSV 2 ANTIBODY, IGG: HSV 2 IgG, Type Spec: >23.6 index — ABNORMAL HIGH (ref 0.00–0.90)

## 2018-03-09 LAB — RPR: RPR Ser Ql: NONREACTIVE

## 2018-03-09 LAB — HEPATITIS C ANTIBODY: Hep C Virus Ab: <0.1 s/co ratio (ref 0.0–0.9)

## 2018-03-09 LAB — HIV ANTIBODY (ROUTINE TESTING W REFLEX): HIV Screen 4th Generation wRfx: NONREACTIVE

## 2018-03-09 LAB — HEMOGLOBIN A1C
ESTIMATED AVERAGE GLUCOSE: 120 mg/dL
HEMOGLOBIN A1C: 5.8 % — AB (ref 4.8–5.6)

## 2018-03-09 LAB — HSV 1 ANTIBODY, IGG

## 2018-03-09 LAB — VITAMIN D 25 HYDROXY (VIT D DEFICIENCY, FRACTURES): Vit D, 25-Hydroxy: 31 ng/mL (ref 30.0–100.0)

## 2018-03-10 ENCOUNTER — Telehealth: Payer: Self-pay

## 2018-03-10 DIAGNOSIS — R2231 Localized swelling, mass and lump, right upper limb: Secondary | ICD-10-CM

## 2018-03-10 NOTE — Telephone Encounter (Signed)
Order for mammo was added per referral cord. Request.

## 2018-03-14 LAB — CHLAMYDIA/GONOCOCCUS/TRICHOMONAS, NAA
Chlamydia by NAA: NEGATIVE
GONOCOCCUS BY NAA: NEGATIVE
TRICH VAG BY NAA: NEGATIVE

## 2018-03-15 ENCOUNTER — Other Ambulatory Visit: Payer: Self-pay | Admitting: Internal Medicine

## 2018-03-15 ENCOUNTER — Encounter: Payer: Self-pay | Admitting: Internal Medicine

## 2018-03-15 DIAGNOSIS — R2231 Localized swelling, mass and lump, right upper limb: Secondary | ICD-10-CM | POA: Insufficient documentation

## 2018-03-15 DIAGNOSIS — B009 Herpesviral infection, unspecified: Secondary | ICD-10-CM | POA: Insufficient documentation

## 2018-03-15 DIAGNOSIS — Z13220 Encounter for screening for lipoid disorders: Secondary | ICD-10-CM

## 2018-03-15 DIAGNOSIS — Z0184 Encounter for antibody response examination: Secondary | ICD-10-CM

## 2018-03-15 DIAGNOSIS — R7303 Prediabetes: Secondary | ICD-10-CM | POA: Insufficient documentation

## 2018-03-15 DIAGNOSIS — Z1159 Encounter for screening for other viral diseases: Secondary | ICD-10-CM

## 2018-03-15 LAB — CHLAMYDIA/GONOCOCCUS/TRICHOMONAS, NAA
CHLAMYDIA BY NAA: NEGATIVE
Gonococcus by NAA: NEGATIVE
TRICH VAG BY NAA: NEGATIVE

## 2018-03-22 ENCOUNTER — Other Ambulatory Visit (INDEPENDENT_AMBULATORY_CARE_PROVIDER_SITE_OTHER): Payer: BLUE CROSS/BLUE SHIELD

## 2018-03-22 DIAGNOSIS — Z13220 Encounter for screening for lipoid disorders: Secondary | ICD-10-CM | POA: Diagnosis not present

## 2018-03-22 DIAGNOSIS — Z0184 Encounter for antibody response examination: Secondary | ICD-10-CM

## 2018-03-22 DIAGNOSIS — Z1159 Encounter for screening for other viral diseases: Secondary | ICD-10-CM

## 2018-03-22 NOTE — Addendum Note (Signed)
Addended by: Arby Barrette on: 03/22/2018 09:45 AM   Modules accepted: Orders

## 2018-03-24 ENCOUNTER — Telehealth: Payer: Self-pay

## 2018-03-24 LAB — LIPID PANEL
CHOLESTEROL TOTAL: 169 mg/dL (ref 100–199)
Chol/HDL Ratio: 2.4 ratio (ref 0.0–4.4)
HDL: 71 mg/dL (ref >39)
LDL Calculated: 85 mg/dL (ref 0–99)
Triglycerides: 67 mg/dL (ref 0–149)
VLDL Cholesterol Cal: 13 mg/dL (ref 5–40)

## 2018-03-24 LAB — MEASLES/MUMPS/RUBELLA IMMUNITY
MUMPS ABS, IGG: 192 [AU]/ml (ref >10.9)
Rubella Antibodies, IGG: 5.39 index (ref >0.99)

## 2018-03-24 NOTE — Telephone Encounter (Signed)
Copied from [place] [id number]. Topic: General - Other >> Mar 24, 2018  2:21 PM Burchel, Abbi R wrote: Reason for CRM:   [facility]  Autumn states that the billing info/address/dx code was left off of pt's lab orders from 03/08/18. Please call to provide info.   Cb: [phone]   ref. [phone]

## 2018-03-28 NOTE — Telephone Encounter (Signed)
According to [facility] everything has been updated.

## 2018-03-30 LAB — HM DIABETES EYE EXAM

## 2018-04-01 ENCOUNTER — Other Ambulatory Visit: Payer: Self-pay | Admitting: *Deleted

## 2018-04-01 ENCOUNTER — Inpatient Hospital Stay
Admission: RE | Admit: 2018-04-01 | Discharge: 2018-04-01 | Disposition: A | Discharge status: Home / Self Care | Payer: Self-pay | Source: Ambulatory Visit | Attending: *Deleted | Admitting: *Deleted

## 2018-04-01 DIAGNOSIS — Z9289 Personal history of other medical treatment: Secondary | ICD-10-CM

## 2018-04-04 ENCOUNTER — Other Ambulatory Visit: Payer: Self-pay | Admitting: Family

## 2018-04-04 DIAGNOSIS — R2231 Localized swelling, mass and lump, right upper limb: Secondary | ICD-10-CM

## 2018-04-04 NOTE — Progress Notes (Signed)
Ordered US right

## 2018-04-11 ENCOUNTER — Ambulatory Visit
Admission: RE | Admit: 2018-04-11 | Discharge: 2018-04-11 | Disposition: A | Discharge status: Home / Self Care | Payer: BLUE CROSS/BLUE SHIELD | Source: Ambulatory Visit | Attending: Internal Medicine | Admitting: Internal Medicine

## 2018-04-11 ENCOUNTER — Ambulatory Visit
Admission: RE | Admit: 2018-04-11 | Discharge: 2018-04-11 | Disposition: A | Discharge status: Home / Self Care | Payer: BLUE CROSS/BLUE SHIELD | Source: Ambulatory Visit | Attending: Family | Admitting: Family

## 2018-04-11 DIAGNOSIS — R2231 Localized swelling, mass and lump, right upper limb: Secondary | ICD-10-CM | POA: Diagnosis not present

## 2018-04-20 ENCOUNTER — Other Ambulatory Visit: Payer: Self-pay

## 2018-04-20 ENCOUNTER — Encounter: Payer: Self-pay | Admitting: Family Medicine

## 2018-04-20 ENCOUNTER — Ambulatory Visit: Payer: BLUE CROSS/BLUE SHIELD | Admitting: Family Medicine

## 2018-04-20 VITALS — BP 114/80 | HR 71 | Temp 98.6°F | Wt 160.2 lb

## 2018-04-20 DIAGNOSIS — R21 Rash and other nonspecific skin eruption: Secondary | ICD-10-CM

## 2018-04-20 MED ORDER — TRIAMCINOLONE ACETONIDE 0.1 % EX CREA: 1.0000 "application " | TOPICAL_CREAM | Freq: Two times a day (BID) | CUTANEOUS | 0 refills | Status: DC

## 2018-04-20 MED ORDER — PREDNISONE 10 MG (21) PO TBPK: ORAL_TABLET | ORAL | 0 refills | Status: DC

## 2018-04-20 NOTE — Patient Instructions (Signed)
Great to meet you! 

## 2018-04-20 NOTE — Progress Notes (Signed)
Subjective:    Patient ID: Courtney Ward, female    DOB: 10-Jul-1966, 52 y.o.   MRN: [id number]  HPI   Patient presents to clinic due to rash on chest.  Patient states rashes been present for about a week.  First noticed it after wearing a silver necklace, she started putting some over-the-counter hydrocortisone and Tinactin cream on the rash-this seemed to help somewhat for a few days, but rash began to come back and spread for up neck.  Patient denies any new lotions, detergents, soaps.  Only thing she can attribute rash to is possibly from the necklace.  States she has had an issue with silver in the past, this was many years ago.  Patient Active Problem List   Diagnosis Date Noted  . Mass of right axilla 03/15/2018  . Prediabetes 03/15/2018  . HSV infection 03/15/2018  . Hypothyroidism 03/08/2018  . MCTD (mixed connective tissue disease) ([place]) 03/08/2018  . MVP (mitral valve prolapse) 03/08/2018  . History of migraine 03/08/2018  . GERD (gastroesophageal reflux disease) 03/08/2018   Social History   Tobacco Use  . Smoking status: Former Research scientist (life sciences)  . Smokeless tobacco: Never Used  . Tobacco comment: from age 31 to 56 3-4 cig maternal side lung cancer   Substance Use Topics  . Alcohol use: Not Currently    Review of Systems   Constitutional: Negative for chills, fatigue and fever.  HENT: Negative for congestion, ear pain, sinus pain and sore throat.   Eyes: Negative.   Respiratory: Negative for cough, shortness of breath and wheezing.   Cardiovascular: Negative for chest pain, palpitations and leg swelling.  Gastrointestinal: Negative for abdominal pain, diarrhea, nausea and vomiting.  Genitourinary: Negative for dysuria, frequency and urgency.  Musculoskeletal: Negative for arthralgias and myalgias.  Skin: +rash on chest Neurological: Negative for syncope, light-headedness and headaches.  Psychiatric/Behavioral: The patient is not nervous/anxious.          Objective:   Physical Exam  Constitutional: She is oriented to person, place, and time. She appears well-developed and well-nourished. No distress.  HENT:  Head: Normocephalic and atraumatic.  Eyes: EOM are normal. No scleral icterus.  Neck: Normal range of motion. Neck supple. No JVD present. No tracheal deviation present.  Cardiovascular: Normal rate and regular rhythm.  Pulmonary/Chest: Effort normal and breath sounds normal. No respiratory distress.  Neurological: She is alert and oriented to person, place, and time.  Skin: Skin is warm and dry. Rash noted.     Rash area on chest presented by red lines on diagram.  Does appear to follow outline of where a necklace with laying on chest.  Skin is faintly red but no vesicles or flaking of skin noted.   Psychiatric: She has a normal mood and affect. Her behavior is normal.  Nursing note and vitals reviewed.   Vitals:   04/20/18 0955  BP: 114/80  Pulse: 71  Temp: 98.6 F (37 C)  SpO2: 99%      Assessment & Plan:   Rash/eruption of skin - patient already takes famotidine and Claritin daily, both these medications can help calm histamine response.  She will do triamcinolone topical cream on rash, advised to do thin layer and to not use this topical cream for any longer than 2 weeks as this can cause skin discoloration.  We will also do high-dose steroid taper to help improve rash.  Advised to not wear any necklaces for at least the next week to 2 weeks until skin  improves.  Keep annual physical appointment as already scheduled in 04/2018

## 2018-05-09 ENCOUNTER — Encounter: Payer: Self-pay | Admitting: Internal Medicine

## 2018-05-11 ENCOUNTER — Encounter: Payer: BLUE CROSS/BLUE SHIELD | Admitting: Internal Medicine

## 2018-05-19 ENCOUNTER — Encounter: Payer: BLUE CROSS/BLUE SHIELD | Admitting: Internal Medicine

## 2018-06-08 ENCOUNTER — Encounter: Payer: BLUE CROSS/BLUE SHIELD | Admitting: Internal Medicine

## 2018-07-01 ENCOUNTER — Other Ambulatory Visit: Payer: Self-pay | Admitting: Internal Medicine

## 2018-07-01 ENCOUNTER — Encounter: Payer: Self-pay | Admitting: Internal Medicine

## 2018-07-01 ENCOUNTER — Ambulatory Visit (INDEPENDENT_AMBULATORY_CARE_PROVIDER_SITE_OTHER): Payer: BLUE CROSS/BLUE SHIELD | Admitting: Internal Medicine

## 2018-07-01 VITALS — BP 110/80 | HR 69 | Temp 98.9°F | Resp 15 | Ht 64.0 in | Wt 164.5 lb

## 2018-07-01 DIAGNOSIS — Z124 Encounter for screening for malignant neoplasm of cervix: Secondary | ICD-10-CM | POA: Diagnosis not present

## 2018-07-01 DIAGNOSIS — N809 Endometriosis, unspecified: Secondary | ICD-10-CM

## 2018-07-01 DIAGNOSIS — N841 Polyp of cervix uteri: Secondary | ICD-10-CM | POA: Diagnosis not present

## 2018-07-01 DIAGNOSIS — R102 Pelvic and perineal pain: Secondary | ICD-10-CM

## 2018-07-01 DIAGNOSIS — Z23 Encounter for immunization: Secondary | ICD-10-CM | POA: Diagnosis not present

## 2018-07-01 DIAGNOSIS — R109 Unspecified abdominal pain: Secondary | ICD-10-CM

## 2018-07-01 DIAGNOSIS — Z0000 Encounter for general adult medical examination without abnormal findings: Secondary | ICD-10-CM | POA: Diagnosis not present

## 2018-07-01 NOTE — Addendum Note (Signed)
Addended by: Leeanne Rio on: 07/01/2018 11:22 AM   Modules accepted: Orders

## 2018-07-01 NOTE — Addendum Note (Signed)
Addended by: Leeanne Rio on: 07/01/2018 11:20 AM   Modules accepted: Orders

## 2018-07-01 NOTE — Patient Instructions (Addendum)
D3 1000-2000 IU daily otc   Try Cetaphil or cerave creams and hydrocortisone cream for face  We will refer to Dr. Blima Rich ward for female pelvic pain and do CT ab/pelvis  You have what appears to be a cervical polyp   Pelvic Pain, Female Pelvic pain is pain in your lower abdomen, below your belly button and between your hips. The pain may start suddenly (acute), keep coming back (recurring), or last a long time (chronic). Pelvic pain that lasts longer than six months is considered chronic. Pelvic pain may affect your:  Reproductive organs.  Urinary system.  Digestive tract.  Musculoskeletal system.  There are many potential causes of pelvic pain. Sometimes, the pain can be a result of digestive or urinary conditions, strained muscles or ligaments, or even reproductive conditions. Sometimes the cause of pelvic pain is not known. Follow these instructions at home:  Take over-the-counter and prescription medicines only as told by your health care provider.  Rest as told by your health care provider.  Do not have sex it if hurts.  Keep a journal of your pelvic pain. Write down: ? When the pain started. ? Where the pain is located. ? What seems to make the pain better or worse, such as food or your menstrual cycle. ? Any symptoms you have along with the pain.  Keep all follow-up visits as told by your health care provider. This is important. Contact a health care provider if:  Medicine does not help your pain.  Your pain comes back.  You have new symptoms.  You have abnormal vaginal discharge or bleeding, including bleeding after menopause.  You have a fever or chills.  You are constipated.  You have blood in your urine or stool.  You have foul-smelling urine.  You feel weak or lightheaded. Get help right away if:  You have sudden severe pain.  Your pain gets steadily worse.  You have severe pain along with fever, nausea, vomiting, or excessive sweating.  You  lose consciousness. This information is not intended to replace advice given to you by your health care provider. Make sure you discuss any questions you have with your health care provider. Document Released: 07/07/2004 Document Revised: 09/04/2015 Document Reviewed: 05/31/2015 Elsevier Interactive Patient Education  2018 [employer].

## 2018-07-01 NOTE — Progress Notes (Addendum)
No chief complaint on file.  Annual  1. C/o female pelvic pain left sided and cramping feels like when she had endometriosis scar tissue had laparascopy 08/2017 and colon attached to bladder had to be detached. Pain 7/10 nothing tried  2. hyperNa has increased water intake  3. C/o dry skin around eyes using moisturizer    Review of Systems  Constitutional: Negative for weight loss.  HENT: Negative for hearing loss.   Eyes: Negative for blurred vision.  Respiratory: Negative for shortness of breath.   Cardiovascular: Negative for chest pain.  Gastrointestinal: Positive for abdominal pain.  Musculoskeletal: Positive for back pain.  Skin: Negative for rash.  Psychiatric/Behavioral: Negative for depression.   Past Medical History:  Diagnosis Date  . Allergy   . Arthritis   . Chicken pox   . Chronic constipation   . Colon polyps   . Connective tissue disease ([place])   . CTS (carpal tunnel syndrome)    right  . CTS (carpal tunnel syndrome)    right   . Fibrocystic breast changes   . GERD (gastroesophageal reflux disease)   . Heart murmur    MVP  . HSV infection   . Migraines   . Multinodular goiter   . MVP (mitral valve prolapse)   . Thyroid disease   . Tubal ectopic pregnancy   . Uterine fibroid   . UTI (urinary tract infection)    Past Surgical History:  Procedure Laterality Date  . APPENDECTOMY     2005-2010 dx lap with appendectomy lysis of adhesions removal right twisted paraovarian cyst   . [place]  . ECTOPIC PREGNANCY SURGERY     2002 surgery to repair   . LAPAROSCOPIC OOPHERECTOMY     left 1996  . THYROIDECTOMY     2007  . TOE SURGERY    . TONSILLECTOMY     1986  . TUBAL LIGATION     2001  . UTERINE FIBROID SURGERY     2004   Family History  Problem Relation Age of Onset  . Arthritis Mother   . Depression Mother   . Diabetes Mother   . Heart disease Mother   . Hyperlipidemia Mother   . Hypertension Mother   . Kidney disease  Mother   . Stroke Mother   . Miscarriages / [place] Mother   . Arthritis Father   . Diabetes Father   . Heart disease Father   . Hyperlipidemia Father   . Hypertension Father   . Arthritis Sister   . Asthma Sister   . Depression Sister   . Drug abuse Sister   . Hyperlipidemia Sister   . Hypertension Sister   . Learning disabilities Sister   . Mental illness Sister   . Alcohol abuse Brother   . Arthritis Brother   . COPD Brother   . Depression Brother   . Drug abuse Brother   . Mental illness Brother   . Arthritis Daughter   . Depression Daughter   . Hypertension Daughter   . Asthma Son   . Arthritis Maternal Grandmother   . Asthma Maternal Grandmother   . Cancer Maternal Grandmother        bone  . Depression Maternal Grandmother   . Drug abuse Maternal Grandmother   . Heart disease Maternal Grandmother   . Hyperlipidemia Maternal Grandmother   . Hypertension Maternal Grandmother   . Miscarriages / Stillbirths Maternal Grandmother   . Alcohol abuse Maternal Grandfather   .  Arthritis Maternal Grandfather   . Cancer Maternal Grandfather        prostate  . COPD Maternal Grandfather   . COPD Paternal Grandmother   . Hyperlipidemia Paternal Grandmother   . Hypertension Paternal Grandmother   . Miscarriages / Stillbirths Paternal Grandmother   . Alcohol abuse Brother   . Arthritis Brother   . Depression Brother   . Diabetes Brother   . Drug abuse Brother   . Hyperlipidemia Brother   . Hypertension Brother   . Learning disabilities Brother   . Mental illness Brother   . Alcohol abuse Brother   . Arthritis Brother   . COPD Brother   . Depression Brother   . Diabetes Brother   . Drug abuse Brother   . Hyperlipidemia Brother   . Hypertension Brother   . Breast cancer Cousin   . Breast cancer Cousin    Social History   Socioeconomic History  . Marital status: Divorced    Spouse name: Not on file  . Number of children: Not on file  . Years of education:  Not on file  . Highest education level: Not on file  Occupational History  . Not on file  Social Needs  . Financial resource strain: Not on file  . Food insecurity:    Worry: Not on file    Inability: Not on file  . Transportation needs:    Medical: Not on file    Non-medical: Not on file  Tobacco Use  . Smoking status: Former Research scientist (life sciences)  . Smokeless tobacco: Never Used  . Tobacco comment: from age 47 to 44 3-4 cig maternal side lung cancer   Substance and Sexual Activity  . Alcohol use: Not Currently  . Drug use: Not Currently  . Sexual activity: Yes    Comment: men  Lifestyle  . Physical activity:    Days per week: Not on file    Minutes per session: Not on file  . Stress: Not on file  Relationships  . Social connections:    Talks on phone: Not on file    Gets together: Not on file    Attends religious service: Not on file    Active member of club or organization: Not on file    Attends meetings of clubs or organizations: Not on file    Relationship status: Not on file  . Intimate partner violence:    Fear of current or ex partner: Not on file    Emotionally abused: Not on file    Physically abused: Not on file    Forced sexual activity: Not on file  Other Topics Concern  . Not on file  Social History Narrative   No guns    Wears seat belt    Safe in relationship    Divorced 4 pregnancies 2 live births    No outpatient medications have been marked as taking for the 07/01/18 encounter (Appointment) with McLean-Scocuzza, Nino Glow, MD.   Allergies  Allergen Reactions  . Cortisone   . Sulfa Antibiotics Hives    Rash    . Tramadol Other (See Comments)    Dizzy and confused. Stays in system for days incapacitated    . Codeine Rash    Nausea, cant move    No results found for this or any previous visit (from the past 2160 hour(s)). Objective  There is no height or weight on file to calculate BMI. Wt Readings from Last 3 Encounters:  04/20/18 160 lb 3.2 oz (72.7  kg)  03/08/18 163 lb (73.9 kg)  01/26/18 165 lb (74.8 kg)   Temp Readings from Last 3 Encounters:  04/20/18 98.6 F (37 C) (Oral)  03/08/18 98.5 F (36.9 C) (Oral)  01/26/18 98.6 F (37 C) (Oral)   BP Readings from Last 3 Encounters:  04/20/18 114/80  03/08/18 (!) 124/58  01/26/18 (!) 152/78   Pulse Readings from Last 3 Encounters:  04/20/18 71  03/08/18 71  01/26/18 76    Physical Exam  Constitutional: She is oriented to person, place, and time. Vital signs are normal. She appears well-developed and well-nourished. She is cooperative.  HENT:  Head: Normocephalic and atraumatic.  Mouth/Throat: Oropharynx is clear and moist and mucous membranes are normal.  Eyes: Pupils are equal, round, and reactive to light. Conjunctivae are normal.  Cardiovascular: Normal rate, regular rhythm and normal heart sounds.  Pulmonary/Chest: Effort normal and breath sounds normal. She exhibits no mass and no tenderness. Right breast exhibits no inverted nipple, no mass, no nipple discharge, no skin change and no tenderness. Left breast exhibits no inverted nipple, no mass, no nipple discharge, no skin change and no tenderness. No breast swelling, tenderness, discharge or bleeding. Breasts are symmetrical.  Abdominal: There is no tenderness.  Genitourinary: Vagina normal and uterus normal. Pelvic exam was performed with patient supine. There is no rash on the right labia. There is no rash on the left labia. Cervix exhibits friability. Cervix exhibits no motion tenderness and no discharge. Right adnexum displays no mass, no tenderness and no fullness. Left adnexum displays no mass, no tenderness and no fullness.    Neurological: She is alert and oriented to person, place, and time. Gait normal.  Skin: Skin is warm, dry and intact.  Psychiatric: She has a normal mood and affect. Her speech is normal and behavior is normal. Judgment and thought content normal. Cognition and memory are normal.  Nursing  note and vitals reviewed.   Assessment   1. Annual  2. Female pelvic pain  3. Hypernatremia 4. Eczema eyes  Plan  1. rec healthy diet choices and exercise   Flu shot had 2018, given high dose flu shot today Check Date of Tdap prior PCP consider hep B titer in future had 2/3 shots pending 3rd, check hep C  HIV and Hep c neg MMR immune   Repeat pap today +cervical polyp and female pelvic pain refer Dr. Blima Rich Ward and do cT ab/pelvis with contrast  Pap 02/2017 need to get records prior PCP -had pap 03/13/11 negative HPV negative  -pap 01/14/12 neg pap neg HPV -had pap 02/27/15 negative no comment on HPV   mammo 04/11/18 normal normal breast exam today  Colonoscopy had 2014 h/o colon polyps prior PCP  HSV 1/2 + Former smoker age [age] 4 cig qd quit age 35 y.o  H/o fibroids noted 04/29/11 pelvic US and right ovary cyst  MRI brain 09/01/10 negative  Xray lumbar 08/04/10 left femoral head osteophyte no joint space narrowing    Eye MD [facility] Dentist Dr. Charlann Noss  Endocrinologist Dr. Anne Hahn with [facility] in [place]  PCP Roel Cluck in [place] last seen 02/25/17 [phone] [facility] MD requested and obtained   2. See above  3. Check BMET today  4. otc creams and hc otc   Rheumatologist Dr. Samantha Crimes had not seen Dr. Tomasa Blase [place] yet appt soon  Dr. Tomasa Blase appt 07/13/18 inflammatory arthritis f/u in 3 months on plaquenil -labs cbc wnl, CK total  111 nl, RA normal, CRP nl, CCP 9 normal ANA neg, CMET nl ESR 4 normal     Saw [facility] 03/30/18 no toxic maculopathy for medication f/u in 1 year Dr. Thomasene Ripple  Provider: Dr. Olivia Mackie McLean-Scocuzza-Internal Medicine

## 2018-07-02 LAB — BASIC METABOLIC PANEL
BUN / CREAT RATIO: 8 — AB (ref 9–23)
BUN: 6 mg/dL (ref 6–24)
CHLORIDE: 105 mmol/L (ref 96–106)
CO2: 24 mmol/L (ref 20–29)
Calcium: 9.8 mg/dL (ref 8.7–10.2)
Creatinine, Ser: 0.78 mg/dL (ref 0.57–1.00)
GFR calc non Af Amer: 86 mL/min/{1.73_m2} (ref >59)
GFR, EST AFRICAN AMERICAN: 100 mL/min/{1.73_m2} (ref >59)
GLUCOSE: 83 mg/dL (ref 65–99)
POTASSIUM: 4.2 mmol/L (ref 3.5–5.2)
SODIUM: 144 mmol/L (ref 134–144)

## 2018-07-05 ENCOUNTER — Telehealth: Payer: Self-pay | Admitting: Internal Medicine

## 2018-07-05 NOTE — Telephone Encounter (Signed)
Sodium now normal   TMS

## 2018-07-06 LAB — PAP LB, RFX HPV ASCU

## 2018-07-06 NOTE — Telephone Encounter (Signed)
Patient was informed of results by [facility]

## 2018-07-07 ENCOUNTER — Telehealth: Payer: Self-pay | Admitting: Internal Medicine

## 2018-07-07 NOTE — Telephone Encounter (Signed)
Pt calling back.  States she will be available from 3p-4p.  Copied from [place] [id number]. Topic: Quick Communication - Lab Results (Clinic Use ONLY) >> Jul 07, 2018 11:49 AM Neta Ehlers, RMA wrote: Called patient to inform them of 07/05/2018 lab results. When patient returns call, triage nurse may disclose results.

## 2018-07-07 NOTE — Telephone Encounter (Signed)
See result notes. 

## 2018-07-07 NOTE — Addendum Note (Signed)
Addended by: Leeanne Rio on: 07/07/2018 09:05 AM   Modules accepted: Orders

## 2018-07-12 ENCOUNTER — Ambulatory Visit: Payer: BLUE CROSS/BLUE SHIELD

## 2018-07-12 LAB — SPECIMEN STATUS REPORT

## 2018-07-13 ENCOUNTER — Ambulatory Visit
Admission: RE | Admit: 2018-07-13 | Discharge: 2018-07-13 | Disposition: A | Discharge status: Home / Self Care | Payer: BLUE CROSS/BLUE SHIELD | Source: Ambulatory Visit | Attending: Internal Medicine | Admitting: Internal Medicine

## 2018-07-13 ENCOUNTER — Other Ambulatory Visit: Payer: Self-pay | Admitting: Internal Medicine

## 2018-07-13 DIAGNOSIS — R102 Pelvic and perineal pain: Secondary | ICD-10-CM | POA: Diagnosis not present

## 2018-07-13 DIAGNOSIS — R918 Other nonspecific abnormal finding of lung field: Secondary | ICD-10-CM | POA: Insufficient documentation

## 2018-07-13 DIAGNOSIS — K439 Ventral hernia without obstruction or gangrene: Secondary | ICD-10-CM | POA: Diagnosis not present

## 2018-07-13 DIAGNOSIS — N809 Endometriosis, unspecified: Secondary | ICD-10-CM | POA: Insufficient documentation

## 2018-07-13 DIAGNOSIS — R911 Solitary pulmonary nodule: Secondary | ICD-10-CM

## 2018-07-13 DIAGNOSIS — R109 Unspecified abdominal pain: Secondary | ICD-10-CM

## 2018-07-13 MED ORDER — IOPAMIDOL (ISOVUE-300) INJECTION 61%
100.0000 mL | Freq: Once | INTRAVENOUS | Status: AC | PRN
  Administered 2018-07-13: 100 mL via INTRAVENOUS

## 2018-07-15 LAB — SPECIMEN STATUS REPORT

## 2018-07-15 LAB — HPV, HIGH+LOW-RISK
HPV DNA High Risk: NEGATIVE
HPV DNA Low Risk: NEGATIVE

## 2018-07-18 ENCOUNTER — Encounter: Payer: Self-pay | Admitting: *Deleted

## 2018-08-01 ENCOUNTER — Other Ambulatory Visit: Payer: Self-pay | Admitting: Family Medicine

## 2018-08-01 ENCOUNTER — Ambulatory Visit
Admission: RE | Admit: 2018-08-01 | Discharge: 2018-08-01 | Disposition: A | Discharge status: Home / Self Care | Payer: BLUE CROSS/BLUE SHIELD | Source: Ambulatory Visit | Attending: Internal Medicine | Admitting: Internal Medicine

## 2018-08-01 ENCOUNTER — Ambulatory Visit: Payer: BLUE CROSS/BLUE SHIELD

## 2018-08-01 DIAGNOSIS — R911 Solitary pulmonary nodule: Secondary | ICD-10-CM

## 2018-08-01 DIAGNOSIS — R21 Rash and other nonspecific skin eruption: Secondary | ICD-10-CM

## 2018-08-01 DIAGNOSIS — R918 Other nonspecific abnormal finding of lung field: Secondary | ICD-10-CM | POA: Diagnosis not present

## 2018-08-26 ENCOUNTER — Telehealth: Payer: Self-pay | Admitting: *Deleted

## 2018-08-26 NOTE — Telephone Encounter (Signed)
Referral placed 07/01/18 please make appt to Dr. Leonides Schanz   Thanks [employer]

## 2018-08-26 NOTE — Telephone Encounter (Signed)
Copied from [place] [id number]. Topic: Referral - Question >> Aug 26, 2018  3:57 PM Reyne Dumas L wrote: Reason for CRM:   States she was told a referral would be sent over to Dr. Leonides Schanz, OB/GYN, but when pt called over there they are stating there is no referral for her on file. 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

## 2018-09-18 ENCOUNTER — Encounter: Payer: Self-pay | Admitting: Internal Medicine

## 2018-12-30 ENCOUNTER — Ambulatory Visit (INDEPENDENT_AMBULATORY_CARE_PROVIDER_SITE_OTHER): Payer: Self-pay | Admitting: Internal Medicine

## 2018-12-30 ENCOUNTER — Other Ambulatory Visit: Payer: Self-pay

## 2018-12-30 DIAGNOSIS — Z1329 Encounter for screening for other suspected endocrine disorder: Secondary | ICD-10-CM

## 2018-12-30 DIAGNOSIS — Z1211 Encounter for screening for malignant neoplasm of colon: Secondary | ICD-10-CM

## 2018-12-30 DIAGNOSIS — M65311 Trigger thumb, right thumb: Secondary | ICD-10-CM

## 2018-12-30 DIAGNOSIS — Z1389 Encounter for screening for other disorder: Secondary | ICD-10-CM

## 2018-12-30 DIAGNOSIS — E039 Hypothyroidism, unspecified: Secondary | ICD-10-CM

## 2018-12-30 DIAGNOSIS — Z0000 Encounter for general adult medical examination without abnormal findings: Secondary | ICD-10-CM

## 2018-12-30 DIAGNOSIS — E559 Vitamin D deficiency, unspecified: Secondary | ICD-10-CM

## 2018-12-30 DIAGNOSIS — Z1231 Encounter for screening mammogram for malignant neoplasm of breast: Secondary | ICD-10-CM

## 2018-12-30 DIAGNOSIS — R232 Flushing: Secondary | ICD-10-CM

## 2018-12-30 DIAGNOSIS — R7303 Prediabetes: Secondary | ICD-10-CM

## 2018-12-30 DIAGNOSIS — Z86010 Personal history of colonic polyps: Secondary | ICD-10-CM

## 2018-12-30 DIAGNOSIS — Z13220 Encounter for screening for lipoid disorders: Secondary | ICD-10-CM

## 2018-12-30 MED ORDER — LEVOTHYROXINE SODIUM 88 MCG PO TABS: 88.0000 ug | ORAL_TABLET | Freq: Every day | ORAL | 3 refills | Status: AC

## 2018-12-30 NOTE — Progress Notes (Addendum)
Virtual Visit via Video Note  I connected with Courtney Ward  on 12/30/18 at  9:12 AM EDT by a video enabled telemedicine application and verified that I am speaking with the correct person using two identifiers.  Location patient: home Location provider:work  Persons participating in the virtual visit: patient, provider  I discussed the limitations of evaluation and management by telemedicine and the availability of in person appointments. The patient expressed understanding and agreed to proceed.   HPI: 1. Prediabetes A1c 5.7 06/29/18  2. C/o hot flashes, trouble sleeping and concentration and prev was on OCP and helped will CC Dr. Leonides Schanz to see if will Rx HRT for hot flashes and reviewed other options I.e clonidine, gabapentin, effexor or paxil pt declines 3. Stress increased dad is sick with bladder, liver and spinal mets cancer ? Type and step mom in home and wants to leave SNF and 2/5 siblings including her are helping to care for the two  4. C/o right thumb trigger finger getting stuck and pain x months she wants to know who to contact advised her to call rheumatology for joint injection and if they dont do this consider ortho hand 5. Hypothyroidism on levo 88 and cytomel and f/u [facility]   ROS: See pertinent positives and negatives per HPI.  Past Medical History:  Diagnosis Date  . Allergy   . Arthritis   . Chicken pox   . Chronic constipation   . Colon polyps   . Connective tissue disease ([place])   . CTS (carpal tunnel syndrome)    right  . CTS (carpal tunnel syndrome)    right   . Endometriosis   . Fibrocystic breast changes   . GERD (gastroesophageal reflux disease)   . Heart murmur    MVP  . HSV infection   . Migraines   . Multinodular goiter   . MVP (mitral valve prolapse)   . Thyroid disease   . Tubal ectopic pregnancy   . Uterine fibroid   . UTI (urinary tract infection)     Past Surgical History:  Procedure Laterality Date  . APPENDECTOMY     2005-2010 dx lap with appendectomy lysis of adhesions removal right twisted paraovarian cyst   . [place]  . ECTOPIC PREGNANCY SURGERY     2002 surgery to repair   . laparascopy     08/2017 colon attached to bladder 08/2017 h/o endometriosis   . LAPAROSCOPIC OOPHERECTOMY     left 1996  . THYROIDECTOMY     2007  . TOE SURGERY    . TONSILLECTOMY     1986  . TUBAL LIGATION     2001  . UTERINE FIBROID SURGERY     2004    Family History  Problem Relation Age of Onset  . Arthritis Mother   . Depression Mother   . Diabetes Mother   . Heart disease Mother   . Hyperlipidemia Mother   . Hypertension Mother   . Kidney disease Mother   . Stroke Mother   . Miscarriages / [place] Mother   . Arthritis Father   . Diabetes Father   . Heart disease Father   . Hyperlipidemia Father   . Hypertension Father   . Arthritis Sister   . Asthma Sister   . Depression Sister   . Drug abuse Sister   . Hyperlipidemia Sister   . Hypertension Sister   . Learning disabilities Sister   . Mental illness Sister   .  Alcohol abuse Brother   . Arthritis Brother   . COPD Brother   . Depression Brother   . Drug abuse Brother   . Mental illness Brother   . Arthritis Daughter   . Depression Daughter   . Hypertension Daughter   . Asthma Son   . Arthritis Maternal Grandmother   . Asthma Maternal Grandmother   . Cancer Maternal Grandmother        bone  . Depression Maternal Grandmother   . Drug abuse Maternal Grandmother   . Heart disease Maternal Grandmother   . Hyperlipidemia Maternal Grandmother   . Hypertension Maternal Grandmother   . Miscarriages / Stillbirths Maternal Grandmother   . Alcohol abuse Maternal Grandfather   . Arthritis Maternal Grandfather   . Cancer Maternal Grandfather        prostate  . COPD Maternal Grandfather   . COPD Paternal Grandmother   . Hyperlipidemia Paternal Grandmother   . Hypertension Paternal Grandmother   . Miscarriages / Stillbirths  Paternal Grandmother   . Alcohol abuse Brother   . Arthritis Brother   . Depression Brother   . Diabetes Brother   . Drug abuse Brother   . Hyperlipidemia Brother   . Hypertension Brother   . Learning disabilities Brother   . Mental illness Brother   . Alcohol abuse Brother   . Arthritis Brother   . COPD Brother   . Depression Brother   . Diabetes Brother   . Drug abuse Brother   . Hyperlipidemia Brother   . Hypertension Brother   . Breast cancer Cousin   . Breast cancer Cousin     SOCIAL HX: works [employer]  Current Outpatient Medications:  .  Calcium Carbonate (CALCIUM-CARB 600 PO), Take 2 tablets by mouth daily., Disp: , Rfl:  .  Cyanocobalamin 1000 MCG CAPS, Take 2 capsules by mouth daily. , Disp: , Rfl:  .  famotidine (PEPCID) 40 MG tablet, Take 40 mg by mouth 2 (two) times daily. , Disp: , Rfl:  .  fluticasone (FLONASE) 50 MCG/ACT nasal spray, Place into both nostrils daily., Disp: , Rfl:  .  hydroxychloroquine (PLAQUENIL) 200 MG tablet, Take 200 mg by mouth 2 (two) times daily. , Disp: , Rfl:  .  levothyroxine (SYNTHROID) 88 MCG tablet, Take 1 tablet (88 mcg total) by mouth daily before breakfast., Disp: 90 tablet, Rfl: 3 .  liothyronine (CYTOMEL) 25 MCG tablet, 1/2 daily., Disp: , Rfl:  .  triamcinolone cream (KENALOG) 0.1 %, APPLY A THIN LAYER TOPICALLY TWICE DAILY. DO NOT USE LONGER THAN 2 WEEKS AT A TIME, Disp: 30 g, Rfl: 2 .  valACYclovir (VALTREX) 500 MG tablet, Take 1 tablet (500 mg total) by mouth daily. Prn bid x 3-7 days with outbreak, Disp: 180 tablet, Rfl: 3  EXAM:  VITALS per patient if applicable:  GENERAL: alert, oriented, appears well and in no acute distress  HEENT: atraumatic, conjunttiva clear, no obvious abnormalities on inspection of external nose and ears  NECK: normal movements of the head and neck  LUNGS: on inspection no signs of respiratory distress, breathing rate appears normal, no obvious gross SOB, gasping or wheezing  CV: no obvious  cyanosis  MS: moves all visible extremities without noticeable abnormality  PSYCH/NEURO: pleasant and cooperative, no obvious depression or anxiety, speech and thought processing grossly intact  ASSESSMENT AND PLAN:  Discussed the following assessment and plan:  Prediabetes - Plan: Hemoglobin A1c  Vitamin D deficiency - Plan: Vitamin D (25 hydroxy)  Hypothyroidism, unspecified  type - Plan: levothyroxine (SYNTHROID) 88 MCG tablet and cytomel f/u [facility]  Trigger finger of right thumb-rec call rheum for steroid injection   Hot flashes will see if OB/GYN will Rx HRT pt declines other options see HPI  [facility] Labs rec due mid 02/2019 ordered [facility]  rec healthy diet choices and exercise   Flu shot utd Check Date of Tdap prior PCP consider hep B titer in future had 2/3 shots pending 3rd Hep C negatove  HIV and Hep c neg MMR immune    +cervical polyp and female pelvic pain refer Dr. Blima Rich Ward and do cT ab/pelvis with contrast -established with Dr. Leonides Schanz ob/gyn pap 07/01/18 neg pap neg HPV   mammo 04/11/18 ordered repeat  Colonoscopy had 2014 h/o colon polyps prior PCP -ordered referral Dr. Jonathon Bellows  HSV 1/2 + Former smoker age [age] 4 cig qd quit age 17 y.o  H/o fibroids noted 04/29/11 pelvic US and right ovary cyst  MRI brain 09/01/10 negative  Xray lumbar 08/04/10 left femoral head osteophyte no joint space narrowing  04/27/19 seen inflammatory arthritis Dr. Amil Amen in [place] BP 148/78 pending cbc, ESR, CRPQ,CMET cont hydroxychloroquin 200 mg 2 tablets qd given prednisone dose pak x 12 days  Trigger finger right thumb rec spliting and voltaren gell  F/u in 6 months  Eye MD [facility] Dentist Dr. Charlann Noss  Endocrinologist Dr. Anne Hahn with [facility] in [place]  PCP Roel Cluck in [place] last seen 02/25/17 [phone] [facility] MD requestedand obtained   [facility] rheumatology and [facility] endocrine  I discussed the assessment and treatment plan with  the patient. The patient was provided an opportunity to ask questions and all were answered. The patient agreed with the plan and demonstrated an understanding of the instructions.   The patient was advised to call back or seek an in-person evaluation if the symptoms worsen or if the condition fails to improve as anticipated.  Time spent 25 minutes  Delorise Jackson, MD

## 2019-01-09 ENCOUNTER — Other Ambulatory Visit: Payer: Self-pay

## 2019-01-09 ENCOUNTER — Telehealth: Payer: Self-pay | Admitting: Gastroenterology

## 2019-01-09 DIAGNOSIS — Z01812 Encounter for preprocedural laboratory examination: Secondary | ICD-10-CM

## 2019-01-09 DIAGNOSIS — Z1211 Encounter for screening for malignant neoplasm of colon: Secondary | ICD-10-CM

## 2019-01-09 MED ORDER — NA SULFATE-K SULFATE-MG SULF 17.5-3.13-1.6 GM/177ML PO SOLN: 1.0000 | Freq: Once | ORAL | 0 refills | Status: AC

## 2019-01-09 NOTE — Telephone Encounter (Signed)
Gastroenterology Pre-Procedure Review  Request Date: 01/27/19 Requesting Physician: Dr. Allen Norris  PATIENT REVIEW QUESTIONS: The patient responded to the following health history questions as indicated:    1. Are you having any GI issues? yes (Gas) 2. Do you have a personal history of Polyps? yes (unsure of the year) 3. Do you have a family history of Colon Cancer or Polyps? no 4. Diabetes Mellitus? no 5. Joint replacements in the past 12 months?no 6. Major health problems in the past 3 months?no 7. Any artificial heart valves, MVP, or defibrillator?Mitral Valve Prolapse    MEDICATIONS & ALLERGIES:    Patient reports the following regarding taking any anticoagulation/antiplatelet therapy:   Plavix, Coumadin, Eliquis, Xarelto, Lovenox, Pradaxa, Brilinta, or Effient? no Aspirin? no  Patient confirms/reports the following medications:  Current Outpatient Medications  Medication Sig Dispense Refill  . Calcium Carbonate (CALCIUM-CARB 600 PO) Take 2 tablets by mouth daily.    . Cyanocobalamin 1000 MCG CAPS Take 2 capsules by mouth daily.     . famotidine (PEPCID) 40 MG tablet Take 40 mg by mouth 2 (two) times daily.     . fluticasone (FLONASE) 50 MCG/ACT nasal spray Place into both nostrils daily.    . hydroxychloroquine (PLAQUENIL) 200 MG tablet Take 200 mg by mouth 2 (two) times daily.     Marland Kitchen levothyroxine (SYNTHROID) 88 MCG tablet Take 1 tablet (88 mcg total) by mouth daily before breakfast. 90 tablet 3  . liothyronine (CYTOMEL) 25 MCG tablet 1/2 daily.    . Na Sulfate-K Sulfate-Mg Sulf 17.5-3.13-1.6 GM/177ML SOLN Take 1 kit by mouth once for 1 dose. 354 mL 0  . triamcinolone cream (KENALOG) 0.1 % APPLY A THIN LAYER TOPICALLY TWICE DAILY. DO NOT USE LONGER THAN 2 WEEKS AT A TIME 30 g 2  . valACYclovir (VALTREX) 500 MG tablet Take 1 tablet (500 mg total) by mouth daily. Prn bid x 3-7 days with outbreak 180 tablet 3   No current facility-administered medications for this visit.     Patient  confirms/reports the following allergies:  Allergies  Allergen Reactions  . Cortisone   . Sulfa Antibiotics Hives    Rash    . Tramadol Other (See Comments)    Dizzy and confused. Stays in system for days incapacitated    . Codeine Rash    Nausea, cant move     No orders of the defined types were placed in this encounter.   AUTHORIZATION INFORMATION Primary Insurance: 1D#: Group #:  Secondary Insurance: 1D#: Group #:  SCHEDULE INFORMATION: Date: 01/27/19 Time: Location:[facility]

## 2019-01-09 NOTE — Telephone Encounter (Signed)
Pt left vm she is returning a call for Sharyn Lull to schedule a colonoscopy.

## 2019-01-10 ENCOUNTER — Encounter: Payer: Self-pay | Admitting: Internal Medicine

## 2019-01-17 ENCOUNTER — Telehealth: Payer: Self-pay | Admitting: Gastroenterology

## 2019-01-17 NOTE — Telephone Encounter (Signed)
LVM returning patients call regarding COVID19 test time.  Left her message to contact pre-admit testing at [phone] as her test needs to be done on Tuesday June 2nd for her Colonoscopy to happen on June 5th.  Thanks [employer]

## 2019-01-17 NOTE — Telephone Encounter (Signed)
Pt is calling she states she needs someone to reschedule her  Covid 19 testing  It needs to be the earliest time possible due to work and also he needs generic rx for the Procedure called in

## 2019-01-23 ENCOUNTER — Encounter
Admission: RE | Admit: 2019-01-23 | Discharge: 2019-01-23 | Disposition: A | Discharge status: Home / Self Care | Payer: 59 | Source: Ambulatory Visit | Attending: Gastroenterology | Admitting: Gastroenterology

## 2019-01-23 ENCOUNTER — Other Ambulatory Visit: Payer: Self-pay

## 2019-01-23 DIAGNOSIS — Z01812 Encounter for preprocedural laboratory examination: Secondary | ICD-10-CM | POA: Insufficient documentation

## 2019-01-23 DIAGNOSIS — Z1159 Encounter for screening for other viral diseases: Secondary | ICD-10-CM | POA: Diagnosis not present

## 2019-01-24 ENCOUNTER — Other Ambulatory Visit: Payer: 59

## 2019-01-24 LAB — NOVEL CORONAVIRUS, NAA (HOSP ORDER, SEND-OUT TO REF LAB; TAT 18-24 HRS): SARS-CoV-2, NAA: NOT DETECTED

## 2019-01-26 ENCOUNTER — Telehealth: Payer: Self-pay | Admitting: Gastroenterology

## 2019-01-26 NOTE — Telephone Encounter (Signed)
Shanon Brow from the [facility] health pre-service center left vm regarding pt procedure tomorrow with  Dr. Jerilynn Som has [facility] commercial and authorization is required his cb [phone]

## 2019-01-26 NOTE — Discharge Instructions (Signed)
General Anesthesia, Adult, Care After  This sheet gives you information about how to care for yourself after your procedure. Your health care provider may also give you more specific instructions. If you have problems or questions, contact your health care provider.  What can I expect after the procedure?  After the procedure, the following side effects are common:  Pain or discomfort at the IV site.  Nausea.  Vomiting.  Sore throat.  Trouble concentrating.  Feeling cold or chills.  Weak or tired.  Sleepiness and fatigue.  Soreness and body aches. These side effects can affect parts of the body that were not involved in surgery.  Follow these instructions at home:    For at least 24 hours after the procedure:  Have a responsible adult stay with you. It is important to have someone help care for you until you are awake and alert.  Rest as needed.  Do not:  Participate in activities in which you could fall or become injured.  Drive.  Use heavy machinery.  Drink alcohol.  Take sleeping pills or medicines that cause drowsiness.  Make important decisions or sign legal documents.  Take care of children on your own.  Eating and drinking  Follow any instructions from your health care provider about eating or drinking restrictions.  When you feel hungry, start by eating small amounts of foods that are soft and easy to digest (bland), such as toast. Gradually return to your regular diet.  Drink enough fluid to keep your urine pale yellow.  If you vomit, rehydrate by drinking water, juice, or clear broth.  General instructions  If you have sleep apnea, surgery and certain medicines can increase your risk for breathing problems. Follow instructions from your health care provider about wearing your sleep device:  Anytime you are sleeping, including during daytime naps.  While taking prescription pain medicines, sleeping medicines, or medicines that make you drowsy.  Return to your normal activities as told by your health care  provider. Ask your health care provider what activities are safe for you.  Take over-the-counter and prescription medicines only as told by your health care provider.  If you smoke, do not smoke without supervision.  Keep all follow-up visits as told by your health care provider. This is important.  Contact a health care provider if:  You have nausea or vomiting that does not get better with medicine.  You cannot eat or drink without vomiting.  You have pain that does not get better with medicine.  You are unable to pass urine.  You develop a skin rash.  You have a fever.  You have redness around your IV site that gets worse.  Get help right away if:  You have difficulty breathing.  You have chest pain.  You have blood in your urine or stool, or you vomit blood.  Summary  After the procedure, it is common to have a sore throat or nausea. It is also common to feel tired.  Have a responsible adult stay with you for the first 24 hours after general anesthesia. It is important to have someone help care for you until you are awake and alert.  When you feel hungry, start by eating small amounts of foods that are soft and easy to digest (bland), such as toast. Gradually return to your regular diet.  Drink enough fluid to keep your urine pale yellow.  Return to your normal activities as told by your health care provider. Ask your health care   provider what activities are safe for you.  This information is not intended to replace advice given to you by your health care provider. Make sure you discuss any questions you have with your health care provider.  Document Released: 11/16/2000 Document Revised: 03/26/2017 Document Reviewed: 03/26/2017  Elsevier Interactive Patient Education  2019 [employer]

## 2019-01-27 ENCOUNTER — Other Ambulatory Visit: Payer: Self-pay

## 2019-01-27 ENCOUNTER — Ambulatory Visit
Admission: RE | Admit: 2019-01-27 | Discharge: 2019-01-27 | Disposition: A | Discharge status: Home / Self Care | Payer: 59 | Attending: Gastroenterology | Admitting: Gastroenterology

## 2019-01-27 ENCOUNTER — Ambulatory Visit: Payer: 59 | Admitting: Anesthesiology

## 2019-01-27 ENCOUNTER — Encounter
Admission: RE | Disposition: A | Discharge status: Home / Self Care | Payer: Self-pay | Source: Home / Self Care | Attending: Gastroenterology

## 2019-01-27 DIAGNOSIS — Z79890 Hormone replacement therapy (postmenopausal): Secondary | ICD-10-CM | POA: Diagnosis not present

## 2019-01-27 DIAGNOSIS — Z87891 Personal history of nicotine dependence: Secondary | ICD-10-CM | POA: Insufficient documentation

## 2019-01-27 DIAGNOSIS — D120 Benign neoplasm of cecum: Secondary | ICD-10-CM | POA: Diagnosis not present

## 2019-01-27 DIAGNOSIS — K219 Gastro-esophageal reflux disease without esophagitis: Secondary | ICD-10-CM | POA: Diagnosis not present

## 2019-01-27 DIAGNOSIS — M1990 Unspecified osteoarthritis, unspecified site: Secondary | ICD-10-CM | POA: Insufficient documentation

## 2019-01-27 DIAGNOSIS — Z79899 Other long term (current) drug therapy: Secondary | ICD-10-CM | POA: Diagnosis not present

## 2019-01-27 DIAGNOSIS — Z860100 Personal history of colon polyps, unspecified: Secondary | ICD-10-CM

## 2019-01-27 DIAGNOSIS — L949 Localized connective tissue disorder, unspecified: Secondary | ICD-10-CM | POA: Diagnosis not present

## 2019-01-27 DIAGNOSIS — K648 Other hemorrhoids: Secondary | ICD-10-CM | POA: Insufficient documentation

## 2019-01-27 DIAGNOSIS — E890 Postprocedural hypothyroidism: Secondary | ICD-10-CM | POA: Diagnosis not present

## 2019-01-27 DIAGNOSIS — Z1211 Encounter for screening for malignant neoplasm of colon: Secondary | ICD-10-CM | POA: Insufficient documentation

## 2019-01-27 DIAGNOSIS — I341 Nonrheumatic mitral (valve) prolapse: Secondary | ICD-10-CM | POA: Insufficient documentation

## 2019-01-27 HISTORY — PX: COLONOSCOPY WITH PROPOFOL: SHX5780

## 2019-01-27 HISTORY — DX: Other complications of anesthesia, initial encounter: T88.59XA

## 2019-01-27 HISTORY — PX: POLYPECTOMY: SHX5525

## 2019-01-27 SURGERY — COLONOSCOPY WITH PROPOFOL
Anesthesia: General | Site: Rectum

## 2019-01-27 MED ORDER — PROPOFOL 10 MG/ML IV BOLUS
INTRAVENOUS | Status: DC | PRN
  Administered 2019-01-27 (×2): 40 mg via INTRAVENOUS
  Administered 2019-01-27: 100 mg via INTRAVENOUS
  Administered 2019-01-27 (×2): 50 mg via INTRAVENOUS

## 2019-01-27 MED ORDER — LIDOCAINE HCL (CARDIAC) PF 100 MG/5ML IV SOSY
PREFILLED_SYRINGE | INTRAVENOUS | Status: DC | PRN
  Administered 2019-01-27: 30 mg via INTRAVENOUS

## 2019-01-27 MED ORDER — STERILE WATER FOR IRRIGATION IR SOLN
Status: DC | PRN
  Administered 2019-01-27: 09:00:00

## 2019-01-27 MED ORDER — SODIUM CHLORIDE 0.9 % IV SOLN: INTRAVENOUS | Status: DC

## 2019-01-27 MED ORDER — GLYCOPYRROLATE 0.2 MG/ML IJ SOLN
INTRAMUSCULAR | Status: DC | PRN
  Administered 2019-01-27: 0.1 mg via INTRAVENOUS

## 2019-01-27 MED ORDER — LACTATED RINGERS IV SOLN
10.0000 mL/h | INTRAVENOUS | Status: DC
  Administered 2019-01-27: 09:00:00 via INTRAVENOUS

## 2019-01-27 SURGICAL SUPPLY — 6 items
CANISTER SUCT 1200ML W/VALVE (MISCELLANEOUS) ×2 IMPLANT
FORCEPS BIOP RAD 4 LRG CAP 4 (CUTTING FORCEPS) ×1 IMPLANT
GOWN CVR UNV OPN BCK APRN NK (MISCELLANEOUS) ×2 IMPLANT
GOWN ISOL THUMB LOOP REG UNIV (MISCELLANEOUS) ×2
KIT ENDO PROCEDURE OLY (KITS) ×2 IMPLANT
WATER STERILE IRR 250ML POUR (IV SOLUTION) ×2 IMPLANT

## 2019-01-27 NOTE — Op Note (Signed)
[facility]astroenter[facility] Patient Name: Courtney Ward Procedure Date: 01/27/2019 8:11 AM MRN: [id number] Account #: [id number] Date of Birth: 15-Nov-1965 Admit Type: Outpatient Age: 53 Room: [facility] OR ROOM 01 Gender: Female Note Status: Finalized Procedure:            Colonoscopy Indications:          High risk colon cancer surveillance: Personal history                        of colonic polyps Providers:            [name] MD, MD Referring MD:         Nino Glow Mclean-Scocuzza MD, MD (Referring MD) Medicines:            Propofol per Anesthesia Complications:        No immediate complications. Procedure:            Pre-Anesthesia Assessment:                       - Prior to the procedure, a History and Physical was                        performed, and patient medications and allergies were                        reviewed. The patient's tolerance of previous                        anesthesia was also reviewed. The risks and benefits of                        the procedure and the sedation options and risks were                        discussed with the patient. All questions were                        answered, and informed consent was obtained. Prior                        Anticoagulants: The patient has taken no previous                        anticoagulant or antiplatelet agents. ASA Grade                        Assessment: II - A patient with mild systemic disease.                        After reviewing the risks and benefits, the patient was                        deemed in satisfactory condition to undergo the                        procedure.                       After obtaining informed consent, the colonoscope was  passed under direct vision. Throughout the procedure,                        the patient's blood pressure, pulse, and oxygen                        saturations were monitored continuously. The was        introduced through the anus and advanced to the the                        cecum, identified by appendiceal orifice and ileocecal                        valve. The colonoscopy was performed without                        difficulty. The patient tolerated the procedure well.                        The quality of the bowel preparation was excellent. Findings:      The perianal and digital rectal examinations were normal.      A 2 mm polyp was found in the cecum. The polyp was sessile. The polyp       was removed with a cold biopsy forceps. Resection and retrieval were       complete.      Non-bleeding internal hemorrhoids were found during retroflexion. The       hemorrhoids were Grade II (internal hemorrhoids that prolapse but reduce       spontaneously). Impression:           - One 2 mm polyp in the cecum, removed with a cold                        biopsy forceps. Resected and retrieved.                       - Non-bleeding internal hemorrhoids. Recommendation:       - Discharge patient to home.                       - Resume previous diet.                       - Continue present medications.                       - Await pathology results.                       - Repeat colonoscopy in 5 years for surveillance. Procedure Code(s):    --- Professional ---                       [phone], Colonoscopy, flexible; with biopsy, single or                        multiple Diagnosis Code(s):    --- Professional ---                       Z86.010, Personal history of colonic polyps  K63.5, Polyp of colon                       K64.1, Second degree hemorrhoids CPT copyright 2019 American Medical Association. All rights reserved. The codes documented in this report are preliminary and upon coder review may  be revised to meet current compliance requirements. [name] MD, MD 01/27/2019 8:42:53 AM This report has been signed electronically. Number of Addenda: 0 Note Initiated  On: 01/27/2019 8:11 AM Scope Withdrawal Time: 0 hours 6 minutes 55 seconds  Total Procedure Duration: 0 hours 10 minutes 20 seconds       [facility]

## 2019-01-27 NOTE — Transfer of Care (Signed)
Immediate Anesthesia Transfer of Care Note  Patient: Courtney Ward  Procedure(s) Performed: COLONOSCOPY WITH BIOPSY (N/A Rectum) POLYPECTOMY (N/A Rectum)  Patient Location: PACU  Anesthesia Type: General  Level of Consciousness: awake, alert  and patient cooperative  Airway and Oxygen Therapy: Patient Spontanous Breathing and Patient connected to supplemental oxygen  Post-op Assessment: Post-op Vital signs reviewed, Patient's Cardiovascular Status Stable, Respiratory Function Stable, Patent Airway and No signs of Nausea or vomiting  Post-op Vital Signs: Reviewed and stable  Complications: No apparent anesthesia complications

## 2019-01-27 NOTE — H&P (Addendum)
Courtney Lame, MD [address] [place], [place] [address] Phone:[phone] Fax : [phone]  Primary Care Physician:  McLean-Scocuzza, Courtney Glow, MD Primary Gastroenterologist:  Dr. Allen Norris  Pre-Procedure History & Physical: HPI:  Courtney Ward is a 53 y.o. female is here for an colonoscopy.   Past Medical History:  Diagnosis Date   Allergy    Arthritis    Chicken pox    Chronic constipation    Colon polyps    Complication of anesthesia    per pt report she is "difficult to wake up"   Connective tissue disease ([place])    CTS (carpal tunnel syndrome)    right   CTS (carpal tunnel syndrome)    right    Endometriosis    Fibrocystic breast changes    GERD (gastroesophageal reflux disease)    Heart murmur    MVP   HSV infection    Migraines    Multinodular goiter    MVP (mitral valve prolapse)    Thyroid disease    Tubal ectopic pregnancy    Uterine fibroid    UTI (urinary tract infection)     Past Surgical History:  Procedure Laterality Date   ABLATION ON ENDOMETRIOSIS  2019   APPENDECTOMY     2005-2010 dx lap with appendectomy lysis of adhesions removal right twisted paraovarian cyst    [facility]     2002 surgery to repair    laparascopy     08/2017 colon attached to bladder 08/2017 h/o endometriosis    LAPAROSCOPIC OOPHERECTOMY     left 1996   THYROIDECTOMY     2007   TOE SURGERY     TONSILLECTOMY     1986   TUBAL LIGATION     2001   UTERINE FIBROID SURGERY     2004    Prior to Admission medications   Medication Sig Start Date End Date Taking? Authorizing Provider  Calcium Carbonate (CALCIUM-CARB 600 PO) Take 2 tablets by mouth daily.   Yes [provider]  Cyanocobalamin 1000 MCG CAPS Take 2 capsules by mouth daily.  12/10/08  Yes [provider]  famotidine (PEPCID) 40 MG tablet Take 40 mg by mouth 2 (two) times daily.    Yes [provider]  fexofenadine-pseudoephedrine (ALLEGRA-D 24) 180-240 MG 24 hr tablet Take 1 tablet by mouth daily.   Yes [provider]  fluticasone (FLONASE) 50 MCG/ACT nasal spray Place into both nostrils daily.   Yes [provider]  hydroxychloroquine (PLAQUENIL) 200 MG tablet Take 200 mg by mouth 2 (two) times daily.  11/09/11  Yes [provider]  levothyroxine (SYNTHROID) 88 MCG tablet Take 1 tablet (88 mcg total) by mouth daily before breakfast. 12/30/18  Yes McLean-Scocuzza, Courtney Glow, MD  liothyronine (CYTOMEL) 25 MCG tablet 1/2 daily. 11/23/17  Yes [provider]  Probiotic Product (PROBIOTIC-10 ULTIMATE PO) Take by mouth.   Yes [provider]  valACYclovir (VALTREX) 500 MG tablet Take 1 tablet (500 mg total) by mouth daily. Prn bid x 3-7 days with outbreak 03/08/18  Yes McLean-Scocuzza, Courtney Glow, MD  triamcinolone cream (KENALOG) 0.1 % APPLY A THIN LAYER TOPICALLY TWICE DAILY. DO NOT USE LONGER THAN 2 WEEKS AT A TIME Patient not taking: Reported on 01/27/2019 08/02/18   McLean-Scocuzza, Courtney Glow, MD    Allergies as of 01/09/2019 - Review Complete 12/30/2018  Allergen Reaction Noted   Cortisone  05/09/2018  Sulfa antibiotics Hives 01/24/2018   Tramadol Other (See Comments) 01/24/2018   Codeine Rash 01/24/2018    Family History  Problem Relation Age of Onset   Arthritis Mother    Depression Mother    Diabetes Mother    Heart disease Mother    Hyperlipidemia Mother    Hypertension Mother    Kidney disease Mother    Stroke Mother    26 / [place] Mother    Arthritis Father    Diabetes Father    Heart disease Father    Hyperlipidemia Father    Hypertension Father    Arthritis Sister    Asthma Sister    Depression Sister    Drug abuse Sister    Hyperlipidemia Sister    Hypertension Sister    Learning disabilities Sister    Mental illness Sister    Alcohol abuse Brother    Arthritis Brother     COPD Brother    Depression Brother    Drug abuse Brother    Mental illness Brother    Arthritis Daughter    Depression Daughter    Hypertension Daughter    Asthma Son    Arthritis Maternal Grandmother    Asthma Maternal Grandmother    Cancer Maternal Grandmother        bone   Depression Maternal Grandmother    Drug abuse Maternal Grandmother    Heart disease Maternal Grandmother    Hyperlipidemia Maternal Grandmother    Hypertension Maternal Grandmother    Miscarriages / Stillbirths Maternal Grandmother    Alcohol abuse Maternal Grandfather    Arthritis Maternal Grandfather    Cancer Maternal Grandfather        prostate   COPD Maternal Grandfather    COPD Paternal Grandmother    Hyperlipidemia Paternal Grandmother    Hypertension Paternal Grandmother    Miscarriages / Stillbirths Paternal Grandmother    Alcohol abuse Brother    Arthritis Brother    Depression Brother    Diabetes Brother    Drug abuse Brother    Hyperlipidemia Brother    Hypertension Brother    Learning disabilities Brother    Mental illness Brother    Alcohol abuse Brother    Arthritis Brother    COPD Brother    Depression Brother    Diabetes Brother    Drug abuse Brother    Hyperlipidemia Brother    Hypertension Brother    Breast cancer Cousin    Breast cancer Cousin     Social History   Socioeconomic History   Marital status: Divorced    Spouse name: Not on file   Number of children: Not on file   Years of education: Not on file   Highest education level: Not on file  Occupational History   Not on file  Social Needs   Financial resource strain: Not on file   Food insecurity:    Worry: Not on file    Inability: Not on file   Transportation needs:    Medical: Not on file    Non-medical: Not on file  Tobacco Use   Smoking status: Former Smoker   Smokeless tobacco: Never Used   Tobacco comment: from age 73 to 2 3-4 cig  maternal side lung cancer   Substance and Sexual Activity   Alcohol use: Not Currently   Drug use: Not Currently   Sexual activity: Yes    Comment: men  Lifestyle   Physical activity:    Days per week: Not on file  Minutes per session: Not on file   Stress: Not on file  Relationships   Social connections:    Talks on phone: Not on file    Gets together: Not on file    Attends religious service: Not on file    Active member of club or organization: Not on file    Attends meetings of clubs or organizations: Not on file    Relationship status: Not on file   Intimate partner violence:    Fear of current or ex partner: Not on file    Emotionally abused: Not on file    Physically abused: Not on file    Forced sexual activity: Not on file  Other Topics Concern   Not on file  Social History Narrative   No guns    Wears seat belt    Safe in relationship    Divorced 4 pregnancies 2 live births     Review of Systems: See HPI, otherwise negative ROS  Physical Exam: BP 119/66    Pulse (!) 58    Temp 97.9 F (36.6 C) (Temporal)    Resp 18    Ht 5\' 4"  (1.626 m)    Wt 77.6 kg    SpO2 100%    BMI 29.35 kg/m  General:   Alert,  pleasant and cooperative in NAD Head:  Normocephalic and atraumatic. Neck:  Supple; no masses or thyromegaly. Lungs:  Clear throughout to auscultation.    Heart:  Regular rate and rhythm. Abdomen:  Soft, nontender and nondistended. Normal bowel sounds, without guarding, and without rebound.   Neurologic:  Alert and  oriented x4;  grossly normal neurologically.  Impression/Plan: Hyacinth J Mayden is here for an colonoscopy to be performed for history of colon poly[place].  Risks, benefits, limitations, and alternatives regarding  colonoscopy have been reviewed with the patient.  Questions have been answered.  All parties agreeable.   Courtney Lame, MD  01/27/2019, 7:36 AM

## 2019-01-27 NOTE — Anesthesia Procedure Notes (Signed)
Date/Time: 01/27/2019 8:23 AM Performed by: Cameron Ali, CRNA Pre-anesthesia Checklist: Patient identified, Emergency Drugs available, Suction available, Timeout performed and Patient being monitored Patient Re-evaluated:Patient Re-evaluated prior to induction Oxygen Delivery Method: Nasal cannula Placement Confirmation: positive ETCO2

## 2019-01-27 NOTE — Anesthesia Preprocedure Evaluation (Addendum)
Anesthesia Evaluation  Patient identified by MRN, date of birth, ID band Patient awake    Reviewed: Allergy & Precautions, NPO status , Patient's Chart, lab work & pertinent test results  Airway Mallampati: II  TM Distance: >3 FB     Dental   Pulmonary former smoker,    breath sounds clear to auscultation       Cardiovascular + Valvular Problems/Murmurs MVP  Rhythm:Regular Rate:Normal     Neuro/Psych  Headaches,    GI/Hepatic GERD  ,  Endo/Other  Hypothyroidism (s/p thyroidectomy for multinodular goiter)   Renal/GU      Musculoskeletal  (+) Arthritis ,   Abdominal   Peds  Hematology   Anesthesia Other Findings   Reproductive/Obstetrics                            Anesthesia Physical Anesthesia Plan  ASA: II  Anesthesia Plan: General   Post-op Pain Management:    Induction: Intravenous  PONV Risk Score and Plan: TIVA  Airway Management Planned: Nasal Cannula and Natural Airway  Additional Equipment:   Intra-op Plan:   Post-operative Plan:   Informed Consent: I have reviewed the patients History and Physical, chart, labs and discussed the procedure including the risks, benefits and alternatives for the proposed anesthesia with the patient or authorized representative who has indicated his/her understanding and acceptance.       Plan Discussed with: CRNA  Anesthesia Plan Comments:         Anesthesia Quick Evaluation

## 2019-01-27 NOTE — Anesthesia Postprocedure Evaluation (Signed)
Anesthesia Post Note  Patient: Courtney Ward  Procedure(s) Performed: COLONOSCOPY WITH BIOPSY (N/A Rectum) POLYPECTOMY (N/A Rectum)  Patient location during evaluation: PACU Anesthesia Type: General Level of consciousness: awake Pain management: pain level controlled Vital Signs Assessment: post-procedure vital signs reviewed and stable Respiratory status: respiratory function stable Cardiovascular status: stable Postop Assessment: no signs of nausea or vomiting Anesthetic complications: no    Veda Canning

## 2019-01-30 ENCOUNTER — Encounter: Payer: Self-pay | Admitting: Gastroenterology

## 2019-03-09 LAB — LIPID PANEL

## 2019-03-09 LAB — CBC WITH DIFFERENTIAL/PLATELET

## 2019-03-10 LAB — CBC WITH DIFFERENTIAL/PLATELET
Basophils Absolute: 0 10*3/uL (ref 0.0–0.2)
Basos: 1 %
EOS (ABSOLUTE): 0.1 10*3/uL (ref 0.0–0.4)
Eos: 2 %
Hematocrit: 38 % (ref 34.0–46.6)
Hemoglobin: 12.9 g/dL (ref 11.1–15.9)
Immature Grans (Abs): 0 10*3/uL (ref 0.0–0.1)
Immature Granulocytes: 0 %
Lymphocytes Absolute: 1.5 10*3/uL (ref 0.7–3.1)
Lymphs: 31 %
MCH: 31.5 pg (ref 26.6–33.0)
MCHC: 33.9 g/dL (ref 31.5–35.7)
MCV: 93 fL (ref 79–97)
Monocytes Absolute: 0.4 10*3/uL (ref 0.1–0.9)
Monocytes: 8 %
Neutrophils Absolute: 2.9 10*3/uL (ref 1.4–7.0)
Neutrophils: 58 %
Platelets: 254 10*3/uL (ref 150–450)
RBC: 4.09 x10E6/uL (ref 3.77–5.28)
RDW: 12.6 % (ref 11.7–15.4)
WBC: 4.9 10*3/uL (ref 3.4–10.8)

## 2019-03-10 LAB — HEMOGLOBIN A1C
Est. average glucose Bld gHb Est-mCnc: 114 mg/dL
Hgb A1c MFr Bld: 5.6 % (ref 4.8–5.6)

## 2019-03-10 LAB — COMPREHENSIVE METABOLIC PANEL
ALT: 10 IU/L (ref 0–32)
AST: 12 IU/L (ref 0–40)
Albumin/Globulin Ratio: 2.2 (ref 1.2–2.2)
Albumin: 4.3 g/dL (ref 3.8–4.9)
Alkaline Phosphatase: 74 IU/L (ref 39–117)
BUN/Creatinine Ratio: 7 — ABNORMAL LOW (ref 9–23)
BUN: 6 mg/dL (ref 6–24)
Bilirubin Total: 0.5 mg/dL (ref 0.0–1.2)
CO2: 20 mmol/L (ref 20–29)
Calcium: 9.5 mg/dL (ref 8.7–10.2)
Chloride: 108 mmol/L — ABNORMAL HIGH (ref 96–106)
Creatinine, Ser: 0.92 mg/dL (ref 0.57–1.00)
GFR calc Af Amer: 81 mL/min/{1.73_m2} (ref >59)
GFR calc non Af Amer: 70 mL/min/{1.73_m2} (ref >59)
Globulin, Total: 2 g/dL (ref 1.5–4.5)
Glucose: 86 mg/dL (ref 65–99)
Potassium: 4.3 mmol/L (ref 3.5–5.2)
Sodium: 143 mmol/L (ref 134–144)
Total Protein: 6.3 g/dL (ref 6.0–8.5)

## 2019-03-10 LAB — MICROSCOPIC EXAMINATION

## 2019-03-10 LAB — VITAMIN D 25 HYDROXY (VIT D DEFICIENCY, FRACTURES): Vit D, 25-Hydroxy: 25.5 ng/mL — ABNORMAL LOW (ref 30.0–100.0)

## 2019-03-10 LAB — URINALYSIS, ROUTINE W REFLEX MICROSCOPIC
Bilirubin, UA: NEGATIVE
Glucose, UA: NEGATIVE
Leukocytes,UA: NEGATIVE
Nitrite, UA: NEGATIVE
RBC, UA: NEGATIVE
Specific Gravity, UA: 1.021 (ref 1.005–1.030)
Urobilinogen, Ur: 0.2 mg/dL (ref 0.2–1.0)
pH, UA: 6 (ref 5.0–7.5)

## 2019-03-10 LAB — T4, FREE: Free T4: 0.77 ng/dL — ABNORMAL LOW (ref 0.82–1.77)

## 2019-03-10 LAB — LIPID PANEL
Chol/HDL Ratio: 2.1 ratio (ref 0.0–4.4)
Cholesterol, Total: 161 mg/dL (ref 100–199)
HDL: 76 mg/dL (ref >39)
LDL Calculated: 69 mg/dL (ref 0–99)
Triglycerides: 79 mg/dL (ref 0–149)
VLDL Cholesterol Cal: 16 mg/dL (ref 5–40)

## 2019-03-10 LAB — TSH: TSH: 3.61 u[IU]/mL (ref 0.450–4.500)

## 2019-03-27 ENCOUNTER — Other Ambulatory Visit: Payer: Self-pay

## 2019-03-27 DIAGNOSIS — B009 Herpesviral infection, unspecified: Secondary | ICD-10-CM

## 2019-03-27 MED ORDER — VALACYCLOVIR HCL 500 MG PO TABS: 500.0000 mg | ORAL_TABLET | Freq: Every day | ORAL | 3 refills | Status: DC

## 2019-07-05 ENCOUNTER — Other Ambulatory Visit: Payer: Self-pay

## 2019-07-05 ENCOUNTER — Encounter: Payer: Self-pay | Admitting: Internal Medicine

## 2019-07-05 ENCOUNTER — Ambulatory Visit (INDEPENDENT_AMBULATORY_CARE_PROVIDER_SITE_OTHER): Payer: 59 | Admitting: Internal Medicine

## 2019-07-05 VITALS — BP 116/72 | HR 71 | Temp 97.6°F | Ht 64.0 in | Wt 171.4 lb

## 2019-07-05 DIAGNOSIS — Z0000 Encounter for general adult medical examination without abnormal findings: Secondary | ICD-10-CM

## 2019-07-05 DIAGNOSIS — N3000 Acute cystitis without hematuria: Secondary | ICD-10-CM

## 2019-07-05 DIAGNOSIS — K219 Gastro-esophageal reflux disease without esophagitis: Secondary | ICD-10-CM

## 2019-07-05 DIAGNOSIS — E039 Hypothyroidism, unspecified: Secondary | ICD-10-CM

## 2019-07-05 DIAGNOSIS — Z113 Encounter for screening for infections with a predominantly sexual mode of transmission: Secondary | ICD-10-CM | POA: Diagnosis not present

## 2019-07-05 DIAGNOSIS — Z23 Encounter for immunization: Secondary | ICD-10-CM | POA: Diagnosis not present

## 2019-07-05 DIAGNOSIS — M351 Other overlap syndromes: Secondary | ICD-10-CM

## 2019-07-05 MED ORDER — FAMOTIDINE 40 MG PO TABS: 40.0000 mg | ORAL_TABLET | Freq: Two times a day (BID) | ORAL | 3 refills | Status: DC

## 2019-07-05 NOTE — Progress Notes (Signed)
Chief Complaint  Patient presents with  . Annual Exam   Annual  1. mctd and psoriasis per pt f/u Dr. Amil Amen on plaquenil and hands, wrists, thumbs having more pain and trigger finger and f/u in 10/2018 rheumatology in 04/2019 was on steroids which help initially and she also has voltaren gel  2. Vaginal bleeding on OCP will reach out ot ob/gyn  3. Wants STD tests was in relationship with partner x 1 year but not and check std h/o HSV with ex husband    Review of Systems  Constitutional: Negative for weight loss.  HENT: Negative for hearing loss.   Eyes: Negative for blurred vision.  Respiratory: Negative for shortness of breath.   Cardiovascular: Negative for chest pain.  Gastrointestinal: Negative for abdominal pain.       +gas   Genitourinary:       +vaginal spotting   Musculoskeletal: Positive for joint pain.  Skin: Negative for rash.  Neurological: Negative for headaches.  Psychiatric/Behavioral: Negative for depression.   Past Medical History:  Diagnosis Date  . Allergy   . Arthritis   . Chicken pox   . Chronic constipation   . Colon polyps   . Complication of anesthesia    per pt report she is "difficult to wake up"  . Connective tissue disease ([place])   . CTS (carpal tunnel syndrome)    right  . CTS (carpal tunnel syndrome)    right   . Endometriosis   . Fibrocystic breast changes   . GERD (gastroesophageal reflux disease)   . Heart murmur    MVP  . HSV infection   . Migraines   . Multinodular goiter   . MVP (mitral valve prolapse)   . Thyroid disease   . Tubal ectopic pregnancy   . Uterine fibroid   . UTI (urinary tract infection)    Past Surgical History:  Procedure Laterality Date  . ABLATION ON ENDOMETRIOSIS  2019  . APPENDECTOMY     2005-2010 dx lap with appendectomy lysis of adhesions removal right twisted paraovarian cyst   . [place]  . COLONOSCOPY WITH PROPOFOL N/A 01/27/2019   Procedure: COLONOSCOPY WITH BIOPSY;  Surgeon:  [name], MD;  Location: [facility];  Service: Endoscopy;  Laterality: N/A;  . ECTOPIC PREGNANCY SURGERY     2002 surgery to repair   . laparascopy     08/2017 colon attached to bladder 08/2017 h/o endometriosis   . LAPAROSCOPIC OOPHERECTOMY     left 1996  . POLYPECTOMY N/A 01/27/2019   Procedure: POLYPECTOMY;  Surgeon: [name], MD;  Location: [facility];  Service: Endoscopy;  Laterality: N/A;  . THYROIDECTOMY     2007  . TOE SURGERY    . TONSILLECTOMY     1986  . TUBAL LIGATION     2001  . UTERINE FIBROID SURGERY     2004   Family History  Problem Relation Age of Onset  . Arthritis Mother   . Depression Mother   . Diabetes Mother   . Heart disease Mother   . Hyperlipidemia Mother   . Hypertension Mother   . Kidney disease Mother   . Stroke Mother   . Miscarriages / [place] Mother   . Arthritis Father   . Diabetes Father   . Heart disease Father   . Hyperlipidemia Father   . Hypertension Father   . Arthritis Sister   . Asthma Sister   . Depression Sister   .  Drug abuse Sister   . Hyperlipidemia Sister   . Hypertension Sister   . Learning disabilities Sister   . Mental illness Sister   . Alcohol abuse Brother   . Arthritis Brother   . COPD Brother   . Depression Brother   . Drug abuse Brother   . Mental illness Brother   . Arthritis Daughter   . Depression Daughter   . Hypertension Daughter   . Asthma Son   . Arthritis Maternal Grandmother   . Asthma Maternal Grandmother   . Cancer Maternal Grandmother        bone  . Depression Maternal Grandmother   . Drug abuse Maternal Grandmother   . Heart disease Maternal Grandmother   . Hyperlipidemia Maternal Grandmother   . Hypertension Maternal Grandmother   . Miscarriages / Stillbirths Maternal Grandmother   . Alcohol abuse Maternal Grandfather   . Arthritis Maternal Grandfather   . Cancer Maternal Grandfather        prostate  . COPD Maternal Grandfather   . COPD Paternal  Grandmother   . Hyperlipidemia Paternal Grandmother   . Hypertension Paternal Grandmother   . Miscarriages / Stillbirths Paternal Grandmother   . Alcohol abuse Brother   . Arthritis Brother   . Depression Brother   . Diabetes Brother   . Drug abuse Brother   . Hyperlipidemia Brother   . Hypertension Brother   . Learning disabilities Brother   . Mental illness Brother   . Alcohol abuse Brother   . Arthritis Brother   . COPD Brother   . Depression Brother   . Diabetes Brother   . Drug abuse Brother   . Hyperlipidemia Brother   . Hypertension Brother   . Breast cancer Cousin   . Breast cancer Cousin    Social History   Socioeconomic History  . Marital status: Divorced    Spouse name: Not on file  . Number of children: Not on file  . Years of education: Not on file  . Highest education level: Not on file  Occupational History  . Not on file  Social Needs  . Financial resource strain: Not on file  . Food insecurity    Worry: Not on file    Inability: Not on file  . Transportation needs    Medical: Not on file    Non-medical: Not on file  Tobacco Use  . Smoking status: Former Research scientist (life sciences)  . Smokeless tobacco: Never Used  . Tobacco comment: from age 64 to 14 3-4 cig maternal side lung cancer   Substance and Sexual Activity  . Alcohol use: Not Currently  . Drug use: Not Currently  . Sexual activity: Yes    Comment: men  Lifestyle  . Physical activity    Days per week: Not on file    Minutes per session: Not on file  . Stress: Not on file  Relationships  . Social Herbalist on phone: Not on file    Gets together: Not on file    Attends religious service: Not on file    Active member of club or organization: Not on file    Attends meetings of clubs or organizations: Not on file    Relationship status: Not on file  . Intimate partner violence    Fear of current or ex partner: Not on file    Emotionally abused: Not on file    Physically abused: Not on file     Forced sexual activity: Not on  file  Other Topics Concern  . Not on file  Social History Narrative   No guns    Wears seat belt    Safe in relationship    Divorced 4 pregnancies 2 live births    Current Meds  Medication Sig  . Calcium Carbonate (CALCIUM-CARB 600 PO) Take 2 tablets by mouth daily.  . Cholecalciferol (VITAMIN D3 GUMMIES PO) Take 5,000 Int'l Units by mouth.  . famotidine (PEPCID) 40 MG tablet Take 1 tablet (40 mg total) by mouth 2 (two) times daily.  . fexofenadine-pseudoephedrine (ALLEGRA-D 24) 180-240 MG 24 hr tablet Take 1 tablet by mouth daily.  . fluticasone (FLONASE) 50 MCG/ACT nasal spray Place into both nostrils daily.  . hydroxychloroquine (PLAQUENIL) 200 MG tablet Take 200 mg by mouth 2 (two) times daily.   Marland Kitchen levothyroxine (SYNTHROID) 88 MCG tablet Take 1 tablet (88 mcg total) by mouth daily before breakfast.  . liothyronine (CYTOMEL) 25 MCG tablet 1/2 daily.  . norethindrone-ethinyl estradiol (LOESTRIN) 1-20 MG-MCG tablet Take 1 tablet by mouth daily.  . valACYclovir (VALTREX) 500 MG tablet Take 1 tablet (500 mg total) by mouth daily. Prn bid x 3-7 days with outbreak  . [DISCONTINUED] famotidine (PEPCID) 40 MG tablet Take 40 mg by mouth 2 (two) times daily.    Allergies  Allergen Reactions  . Cortisone   . Sulfa Antibiotics Hives    Rash    . Tramadol Other (See Comments)    Dizzy and confused. Stays in system for days incapacitated    . Codeine Rash    Nausea, cant move    No results found for this or any previous visit (from the past 2160 hour(s)). Objective  Body mass index is 29.42 kg/m. Wt Readings from Last 3 Encounters:  07/05/19 171 lb 6.4 oz (77.7 kg)  01/27/19 171 lb (77.6 kg)  07/01/18 164 lb 8 oz (74.6 kg)   Temp Readings from Last 3 Encounters:  07/05/19 97.6 F (36.4 C) (Skin)  01/27/19 (!) 97.3 F (36.3 C)  07/01/18 98.9 F (37.2 C) (Oral)   BP Readings from Last 3 Encounters:  07/05/19 116/72  01/27/19 120/76   07/01/18 110/80   Pulse Readings from Last 3 Encounters:  07/05/19 71  01/27/19 (!) 58  07/01/18 69    Physical Exam Vitals signs and nursing note reviewed.  Constitutional:      Appearance: Normal appearance. She is well-developed and well-groomed.     Comments: +mask on    HENT:     Head: Normocephalic and atraumatic.  Eyes:     Conjunctiva/sclera: Conjunctivae normal.     Pupils: Pupils are equal, round, and reactive to light.  Cardiovascular:     Rate and Rhythm: Normal rate and regular rhythm.     Heart sounds: Normal heart sounds. No murmur.  Pulmonary:     Effort: Pulmonary effort is normal.     Breath sounds: Normal breath sounds.  Chest:     Breasts: Breasts are asymmetrical.        Right: Normal. No swelling, bleeding, inverted nipple, mass, nipple discharge, skin change or tenderness.        Left: Normal. No swelling, bleeding, inverted nipple, mass, nipple discharge, skin change or tenderness.     Comments: Left breast slightly larger than right  Abdominal:     General: Abdomen is flat. Bowel sounds are normal.     Tenderness: There is no abdominal tenderness.  Lymphadenopathy:     Upper Body:  Right upper body: No axillary adenopathy.     Left upper body: No axillary adenopathy.  Skin:    General: Skin is warm and dry.  Neurological:     General: No focal deficit present.     Mental Status: She is alert and oriented to person, place, and time. Mental status is at baseline.     Gait: Gait normal.  Psychiatric:        Attention and Perception: Attention and perception normal.        Mood and Affect: Mood and affect normal.        Speech: Speech normal.        Behavior: Behavior normal. Behavior is cooperative.        Thought Content: Thought content normal.        Cognition and Memory: Cognition and memory normal.        Judgment: Judgment normal.     Assessment  Plan  Annual physical exam Need for immunization against influenza - Plan: Flu  Vaccine QUAD 36+ mos IM given today rec healthy diet choices and exercise Tdap sch another day  consider hep B titer in future had 2/3 shots pending 3rd (ask pt at f/u in 2021 if had 3rd hep B shot) Hep C negatove  HIV and Hep c neg MMR immune    +cervical polyp and female pelvic pain refer Dr. Blima Rich Ward and do cT ab/pelvis with contrast -established with Dr. Leonides Schanz ob/gyn pap 07/01/18 neg pap neg HPV  -vaginal spotting on OCP will inform Dr. Leonides Schanz   mammo 04/11/18 ordered repeat pt to call and schedule breast exam normal today   Colonoscopy had 2014 h/o colon polyps prior PCP - 01/27/19 tubular adenoma f/u 5 years [place] HSV 1/2 + Former smoker age [age] 4 cig qd quit age 57 y.o  H/o fibroids noted 04/29/11 pelvic US and right ovary cyst   Gastroesophageal reflux disease without esophagitis - Plan: famotidine (PEPCID) 40 MG tablet bid  Venereal disease screening - Plan: RPR, HIV antibody (with reflex), Hepatitis B surface antibody,quantitative, Hepatitis C antibody, Hepatitis B Surface AntiGEN, Chlamydia/Gonococcus/Trichomonas, NAA  Hypothyroidism, unspecified type - Plan: TSH f/u endocrine cont synthryoid 88 mcg qd and cytomel 12.5 mg qd   MCTD (mixed connective tissue disease) ([place]) - Plan: Comprehensive metabolic panel, CBC with Differential/Platelet  F/u rheum 10/2019     Review of chart  MRI brain 09/01/10 negative  Xray lumbar 08/04/10 left femoral head osteophyte no joint space narrowing  04/27/19 seen inflammatory arthritis Dr. Amil Amen rheum in [place] BP 148/78 pending cbc, ESR, CRPQ,CMET cont hydroxychloroquin 200 mg 2 tablets qd given prednisone dose pak x 12 days  Trigger finger right thumb rec spliting and voltaren gel F/u in 6 months  Endocrine [facility]   Eye MD [facility] Dentist Dr. Charlann Noss  Endocrinologist Dr. Anne Hahn with [facility] in [place]  PCP Roel Cluck in [place] last seen 02/25/17 [phone] [facility] MD requestedand  obtained  Provider: Dr. Olivia Mackie McLean-Scocuzza-[facility] Medicine

## 2019-07-05 NOTE — Patient Instructions (Addendum)
Vitamin D3 5000 max total day  [place], monterey jack, pepper [place], [place]/ranger, parmesan low to little lactose    Indigestion Indigestion is a feeling of pain, discomfort, burning, or fullness in the upper part of your abdomen. It can come and go. It may occur frequently or rarely. Indigestion tends to occur while you are eating or right after you have finished eating. It may be worse at night and while bending over or lying down. Indigestion may be a symptom of an underlying digestive condition. Follow these instructions at home: Eating and drinking   Follow an eating plan as recommended by your health care provider.  Avoid certain foods and drinks as told by your health care provider. This may include: ? Chocolate and cocoa. ? Peppermint and mint flavorings. ? Garlic and onions. ? Horseradish. ? Spicy and acidic foods, including peppers, chili powder, curry powder, vinegar, hot sauces, and barbecue sauce. ? Citrus fruits, such as oranges, lemons, and limes. ? Tomato-based foods, such as red sauce, chili, salsa, and pizza with red sauce. ? Fried and fatty foods, such as donuts, french fries, potato chips, and high-fat dressings. ? High-fat meats, such as hot dogs and fatty cuts of red and white meats, such as rib eye steak, sausage, ham, and bacon. ? High-fat dairy items, such as whole milk, butter, and cream cheese. ? Coffee and tea (with or without caffeine). ? Drinks that contain alcohol. ? Energy drinks and sports drinks. ? Carbonated drinks or sodas. ? Citrus fruit juices.  Eat small, frequent meals instead of large meals.  Avoid drinking large amounts of liquid with your meals.  Avoid eating meals during the 2-3 hours before bedtime.  Avoid lying down right after you eat.  Avoid exercise for 2 hours after you eat. Lifestyle      Maintain a healthy weight. Ask your health care provider what weight is healthy for you. If you need to lose weight, work with your health  care provider to do so safely.  Exercise for at least 30 minutes on 5 or more days each week, or as told by your health care provider. Avoid exercises that include bending forward. This can make your symptoms worse.  Wear loose-fitting clothing. Do not wear anything tight around your waist that causes pressure on your abdomen.  Do not use any products that contain nicotine or tobacco, such as cigarettes, e-cigarettes, and chewing tobacco. These can make symptoms worse. If you need help quitting, ask your health care provider.  Raise (elevate) the head of your bed about 6 inches (15 cm) when you sleep.  Try to reduce your stress, such as with yoga or meditation. If you need help reducing stress, ask your health care provider. General instructions  Take over-the-counter and prescription medicines only as told by your health care provider. Do not take aspirin, ibuprofen, or other NSAIDs unless your health care provider told you to do so.  Pay attention to any changes in your symptoms.  Keep all follow-up visits as told by your health care provider. This is important. Contact a health care provider if:  You have new symptoms.  You have unexplained weight loss.  You have difficulty swallowing, or it hurts to swallow.  Your symptoms do not improve with treatment.  Your symptoms last for more than 2 days.  You have a fever.  You vomit. Get help right away if:  You have pain in your arms, neck, jaw, teeth, or back.  You feel sweaty, dizzy,  or light-headed.  You faint.  You have chest pain or shortness of breath.  You cannot stop vomiting, or you vomit blood.  Your stool is bloody or black.  You have severe pain in your abdomen. These symptoms may represent a serious problem that is an emergency. Do not wait to see if the symptoms will go away. Get medical help right away. Call your local emergency services (911 in the [place] Do not drive yourself to the  hospital. Summary  Indigestion is a feeling of pain, discomfort, burning, or fullness in the upper part of your abdomen. It tends to occur while you are eating or right after you have finished eating.  Follow an eating plan and other lifestyle changes as told by your health care provider.  Take over-the-counter and prescription medicines only as told by your health care provider. Do not take aspirin, ibuprofen, or other NSAIDs unless your health care provider told you to do so.  Contact your health care provider if your symptoms do not get better or they get worse. Some symptoms may represent a serious problem that is an emergency. Do not wait to see if the symptoms will go away. Get medical help right away. This information is not intended to replace advice given to you by your health care provider. Make sure you discuss any questions you have with your health care provider. Document Released: 09/17/2004 Document Revised: 01/10/2018 Document Reviewed: 01/10/2018 Elsevier Patient Education  2020 [place] Following a healthy eating pattern may help you to achieve and maintain a healthy body weight, reduce the risk of chronic disease, and live a long and productive life. It is important to follow a healthy eating pattern at an appropriate calorie level for your body. Your nutritional needs should be met primarily through food by choosing a variety of nutrient-rich foods. What are tips for following this plan? Reading food labels  Read labels and choose the following: ? Reduced or low sodium. ? Juices with 100% fruit juice. ? Foods with low saturated fats and high polyunsaturated and monounsaturated fats. ? Foods with whole grains, such as whole wheat, cracked wheat, brown rice, and wild rice. ? Whole grains that are fortified with folic acid. This is recommended for women who are pregnant or who want to become pregnant.  Read labels and avoid the following: ? Foods with  a lot of added sugars. These include foods that contain brown sugar, corn sweetener, corn syrup, dextrose, fructose, glucose, high-fructose corn syrup, honey, invert sugar, lactose, malt syrup, maltose, molasses, raw sugar, sucrose, trehalose, or turbinado sugar.  Do not eat more than the following amounts of added sugar per day:  6 teaspoons (25 g) for women.  9 teaspoons (38 g) for men. ? Foods that contain processed or refined starches and grains. ? Refined grain products, such as white flour, degermed cornmeal, white bread, and white rice. Shopping  Choose nutrient-rich snacks, such as vegetables, whole fruits, and nuts. Avoid high-calorie and high-sugar snacks, such as potato chips, fruit snacks, and candy.  Use oil-based dressings and spreads on foods instead of solid fats such as butter, stick margarine, or cream cheese.  Limit pre-made sauces, mixes, and "instant" products such as flavored rice, instant noodles, and ready-made pasta.  Try more plant-protein sources, such as tofu, tempeh, black beans, edamame, lentils, nuts, and seeds.  Explore eating plans such as the Mediterranean diet or vegetarian diet. Cooking  Use oil to saut or stir-fry foods instead of solid  fats such as butter, stick margarine, or lard.  Try baking, boiling, grilling, or broiling instead of frying.  Remove the fatty part of meats before cooking.  Steam vegetables in water or broth. Meal planning   At meals, imagine dividing your plate into fourths: ? One-half of your plate is fruits and vegetables. ? One-fourth of your plate is whole grains. ? One-fourth of your plate is protein, especially lean meats, poultry, eggs, tofu, beans, or nuts.  Include low-fat dairy as part of your daily diet. Lifestyle  Choose healthy options in all settings, including home, work, school, restaurants, or stores.  Prepare your food safely: ? Wash your hands after handling raw meats. ? Keep food preparation  surfaces clean by regularly washing with hot, soapy water. ? Keep raw meats separate from ready-to-eat foods, such as fruits and vegetables. ? Cook seafood, meat, poultry, and eggs to the recommended internal temperature. ? Store foods at safe temperatures. In general:  Keep cold foods at 36F (4.4C) or below.  Keep hot foods at 136F (60C) or above.  Keep your freezer at [facility] (-17.8C) or below.  Foods are no longer safe to eat when they have been between the temperatures of 40-136F (4.4-60C) for more than 2 hours. What foods should I eat? Fruits Aim to eat 2 cup-equivalents of fresh, canned (in natural juice), or frozen fruits each day. Examples of 1 cup-equivalent of fruit include 1 small apple, 8 large strawberries, 1 cup canned fruit,  cup dried fruit, or 1 cup 100% juice. Vegetables Aim to eat 2-3 cup-equivalents of fresh and frozen vegetables each day, including different varieties and colors. Examples of 1 cup-equivalent of vegetables include 2 medium carrots, 2 cups raw, leafy greens, 1 cup chopped vegetable (raw or cooked), or 1 medium baked potato. Grains Aim to eat 6 ounce-equivalents of whole grains each day. Examples of 1 ounce-equivalent of grains include 1 slice of bread, 1 cup ready-to-eat cereal, 3 cups popcorn, or  cup cooked rice, pasta, or cereal. Meats and other proteins Aim to eat 5-6 ounce-equivalents of protein each day. Examples of 1 ounce-equivalent of protein include 1 egg, 1/2 cup nuts or seeds, or 1 tablespoon (16 g) peanut butter. A cut of meat or fish that is the size of a deck of cards is about 3-4 ounce-equivalents.  Of the protein you eat each week, try to have at least 8 ounces come from seafood. This includes salmon, trout, herring, and anchovies. Dairy Aim to eat 3 cup-equivalents of fat-free or low-fat dairy each day. Examples of 1 cup-equivalent of dairy include 1 cup (240 mL) milk, 8 ounces (250 g) yogurt, 1 ounces (44 g) natural cheese, or 1  cup (240 mL) fortified soy milk. Fats and oils  Aim for about 5 teaspoons (21 g) per day. Choose monounsaturated fats, such as canola and olive oils, avocados, peanut butter, and most nuts, or polyunsaturated fats, such as sunflower, corn, and soybean oils, walnuts, pine nuts, sesame seeds, sunflower seeds, and flaxseed. Beverages  Aim for six 8-oz glasses of water per day. Limit coffee to three to five 8-oz cups per day.  Limit caffeinated beverages that have added calories, such as soda and energy drinks.  Limit alcohol intake to no more than 1 drink a day for nonpregnant women and 2 drinks a day for men. One drink equals 12 oz of beer (355 mL), 5 oz of wine (148 mL), or 1 oz of hard liquor (44 mL). Seasoning and other foods  Avoid  adding excess amounts of salt to your foods. Try flavoring foods with herbs and spices instead of salt.  Avoid adding sugar to foods.  Try using oil-based dressings, sauces, and spreads instead of solid fats. This information is based on general [place] nutrition guidelines. For more information, visit Build[employer].es. Exact amounts may vary based on your nutrition needs. Summary  A healthy eating plan may help you to maintain a healthy weight, reduce the risk of chronic diseases, and stay active throughout your life.  Plan your meals. Make sure you eat the right portions of a variety of nutrient-rich foods.  Try baking, boiling, grilling, or broiling instead of frying.  Choose healthy options in all settings, including home, work, school, restaurants, or stores. This information is not intended to replace advice given to you by your health care provider. Make sure you discuss any questions you have with your health care provider. Document Released: 11/22/2017 Document Revised: 11/22/2017 Document Reviewed: 11/22/2017 Elsevier Patient Education  2020 [employer].

## 2019-08-02 ENCOUNTER — Other Ambulatory Visit: Payer: Self-pay

## 2019-08-02 ENCOUNTER — Other Ambulatory Visit: Payer: Self-pay | Admitting: Internal Medicine

## 2019-08-02 ENCOUNTER — Encounter: Payer: Self-pay | Admitting: Internal Medicine

## 2019-08-02 DIAGNOSIS — Z20822 Contact with and (suspected) exposure to covid-19: Secondary | ICD-10-CM

## 2019-08-02 DIAGNOSIS — R509 Fever, unspecified: Secondary | ICD-10-CM

## 2019-08-02 DIAGNOSIS — Z20828 Contact with and (suspected) exposure to other viral communicable diseases: Secondary | ICD-10-CM

## 2019-08-04 ENCOUNTER — Other Ambulatory Visit: Payer: Self-pay | Admitting: Internal Medicine

## 2019-08-04 ENCOUNTER — Encounter: Payer: Self-pay | Admitting: Internal Medicine

## 2019-08-04 DIAGNOSIS — R300 Dysuria: Secondary | ICD-10-CM

## 2019-08-04 LAB — COMPREHENSIVE METABOLIC PANEL
ALT: 6 IU/L (ref 0–32)
AST: 15 IU/L (ref 0–40)
Albumin/Globulin Ratio: 2.4 — ABNORMAL HIGH (ref 1.2–2.2)
Albumin: 4.4 g/dL (ref 3.8–4.9)
Alkaline Phosphatase: 56 IU/L (ref 39–117)
BUN/Creatinine Ratio: 6 — ABNORMAL LOW (ref 9–23)
BUN: 6 mg/dL (ref 6–24)
Bilirubin Total: 0.6 mg/dL (ref 0.0–1.2)
CO2: 20 mmol/L (ref 20–29)
Calcium: 9.2 mg/dL (ref 8.7–10.2)
Chloride: 108 mmol/L — ABNORMAL HIGH (ref 96–106)
Creatinine, Ser: 0.96 mg/dL (ref 0.57–1.00)
GFR calc Af Amer: 77 mL/min/{1.73_m2} (ref >59)
GFR calc non Af Amer: 67 mL/min/{1.73_m2} (ref >59)
Globulin, Total: 1.8 g/dL (ref 1.5–4.5)
Glucose: 98 mg/dL (ref 65–99)
Potassium: 4 mmol/L (ref 3.5–5.2)
Sodium: 142 mmol/L (ref 134–144)
Total Protein: 6.2 g/dL (ref 6.0–8.5)

## 2019-08-04 LAB — CBC WITH DIFFERENTIAL/PLATELET
Basophils Absolute: 0 10*3/uL (ref 0.0–0.2)
Basos: 1 %
EOS (ABSOLUTE): 0.1 10*3/uL (ref 0.0–0.4)
Eos: 2 %
Hematocrit: 38.1 % (ref 34.0–46.6)
Hemoglobin: 13.2 g/dL (ref 11.1–15.9)
Immature Grans (Abs): 0 10*3/uL (ref 0.0–0.1)
Immature Granulocytes: 0 %
Lymphocytes Absolute: 1.3 10*3/uL (ref 0.7–3.1)
Lymphs: 27 %
MCH: 32.8 pg (ref 26.6–33.0)
MCHC: 34.6 g/dL (ref 31.5–35.7)
MCV: 95 fL (ref 79–97)
Monocytes Absolute: 0.4 10*3/uL (ref 0.1–0.9)
Monocytes: 7 %
Neutrophils Absolute: 3.1 10*3/uL (ref 1.4–7.0)
Neutrophils: 63 %
Platelets: 244 10*3/uL (ref 150–450)
RBC: 4.03 x10E6/uL (ref 3.77–5.28)
RDW: 13 % (ref 11.7–15.4)
WBC: 4.9 10*3/uL (ref 3.4–10.8)

## 2019-08-04 LAB — HEPATITIS B SURFACE ANTIGEN: Hepatitis B Surface Ag: NEGATIVE

## 2019-08-04 LAB — CHLAMYDIA/GONOCOCCUS/TRICHOMONAS, NAA
Chlamydia by NAA: NEGATIVE
Gonococcus by NAA: NEGATIVE
Trich vag by NAA: NEGATIVE

## 2019-08-04 LAB — HIV ANTIBODY (ROUTINE TESTING W REFLEX): HIV Screen 4th Generation wRfx: NONREACTIVE

## 2019-08-04 LAB — HEPATITIS B SURFACE ANTIBODY, QUANTITATIVE: Hepatitis B Surf Ab Quant: <3.1 m[IU]/mL — ABNORMAL LOW (ref >9.9)

## 2019-08-04 LAB — NOVEL CORONAVIRUS, NAA: SARS-CoV-2, NAA: NOT DETECTED

## 2019-08-04 LAB — TSH: TSH: 6.26 u[IU]/mL — ABNORMAL HIGH (ref 0.450–4.500)

## 2019-08-04 LAB — RPR: RPR Ser Ql: NONREACTIVE

## 2019-08-04 LAB — HEPATITIS C ANTIBODY: Hep C Virus Ab: <0.1 s/co ratio (ref 0.0–0.9)

## 2019-08-07 ENCOUNTER — Ambulatory Visit: Payer: 59

## 2019-08-17 ENCOUNTER — Ambulatory Visit: Payer: 59

## 2019-08-24 ENCOUNTER — Ambulatory Visit: Payer: 59 | Attending: Internal Medicine

## 2019-08-24 DIAGNOSIS — Z20822 Contact with and (suspected) exposure to covid-19: Secondary | ICD-10-CM

## 2019-08-30 LAB — NOVEL CORONAVIRUS, NAA

## 2019-08-31 ENCOUNTER — Ambulatory Visit: Payer: 59

## 2019-09-01 ENCOUNTER — Ambulatory Visit: Payer: 59 | Attending: Internal Medicine

## 2019-09-01 DIAGNOSIS — Z20822 Contact with and (suspected) exposure to covid-19: Secondary | ICD-10-CM

## 2019-09-02 LAB — NOVEL CORONAVIRUS, NAA: SARS-CoV-2, NAA: NOT DETECTED

## 2019-11-08 ENCOUNTER — Telehealth: Payer: Self-pay | Admitting: Internal Medicine

## 2019-11-08 ENCOUNTER — Other Ambulatory Visit: Payer: Self-pay | Admitting: Internal Medicine

## 2019-11-08 NOTE — Addendum Note (Signed)
Addended by: Orland Mustard on: 11/08/2019 01:51 PM   Modules accepted: Orders

## 2019-11-08 NOTE — Telephone Encounter (Signed)
Patient informed and verbalized understanding.  She will go to Urgent Care

## 2019-11-08 NOTE — Telephone Encounter (Signed)
We cant just call in abx w/o evaluation  She can send me a My chart and agreeable to my chart fee but explain sx's and she is agreeable to my chart fee  She can schedule appt  Go to urgent care  Or  Sch a telephone visit  Or  See another [place] clinic who has an open slot   We always need urine with uti sx's  Ive ordered these for [facility] if she wants to be evaluated and tx'ed by me   MTs

## 2019-11-08 NOTE — Telephone Encounter (Signed)
Pt is having symptoms of UTI. Pt has already tried AZO and drinking lots of water. Pressure, back pain, frequent urination. Pt would like something called in asap if possible. Please call pt back

## 2019-11-08 NOTE — Telephone Encounter (Signed)
Please advise 

## 2019-11-15 ENCOUNTER — Other Ambulatory Visit: Payer: Self-pay | Admitting: Internal Medicine

## 2019-11-15 ENCOUNTER — Ambulatory Visit
Admission: RE | Admit: 2019-11-15 | Discharge: 2019-11-15 | Disposition: A | Discharge status: Home / Self Care | Payer: 59 | Source: Ambulatory Visit | Attending: Internal Medicine | Admitting: Internal Medicine

## 2019-11-15 DIAGNOSIS — Z1231 Encounter for screening mammogram for malignant neoplasm of breast: Secondary | ICD-10-CM

## 2019-11-15 DIAGNOSIS — N6489 Other specified disorders of breast: Secondary | ICD-10-CM

## 2019-11-15 DIAGNOSIS — R928 Other abnormal and inconclusive findings on diagnostic imaging of breast: Secondary | ICD-10-CM

## 2019-11-23 ENCOUNTER — Ambulatory Visit
Admission: RE | Admit: 2019-11-23 | Discharge: 2019-11-23 | Disposition: A | Discharge status: Home / Self Care | Payer: 59 | Source: Ambulatory Visit | Attending: Internal Medicine | Admitting: Internal Medicine

## 2019-11-23 ENCOUNTER — Other Ambulatory Visit: Payer: Self-pay | Admitting: Internal Medicine

## 2019-11-23 DIAGNOSIS — N6489 Other specified disorders of breast: Secondary | ICD-10-CM

## 2019-11-23 DIAGNOSIS — N6320 Unspecified lump in the left breast, unspecified quadrant: Secondary | ICD-10-CM

## 2019-11-23 DIAGNOSIS — R928 Other abnormal and inconclusive findings on diagnostic imaging of breast: Secondary | ICD-10-CM

## 2020-01-03 ENCOUNTER — Ambulatory Visit: Payer: 59 | Admitting: Internal Medicine

## 2020-01-16 ENCOUNTER — Ambulatory Visit: Payer: 59 | Admitting: Internal Medicine

## 2020-01-18 ENCOUNTER — Other Ambulatory Visit: Payer: Self-pay | Admitting: Internal Medicine

## 2020-01-18 DIAGNOSIS — B009 Herpesviral infection, unspecified: Secondary | ICD-10-CM

## 2020-02-07 ENCOUNTER — Telehealth: Payer: Self-pay | Admitting: Internal Medicine

## 2020-02-07 NOTE — Telephone Encounter (Signed)
Prior authorization has been approved for Valtrex

## 2020-03-04 ENCOUNTER — Other Ambulatory Visit: Payer: Self-pay

## 2020-03-04 DIAGNOSIS — B009 Herpesviral infection, unspecified: Secondary | ICD-10-CM

## 2020-03-04 MED ORDER — VALACYCLOVIR HCL 500 MG PO TABS: 500.0000 mg | ORAL_TABLET | Freq: Every day | ORAL | 3 refills | Status: DC

## 2020-03-29 ENCOUNTER — Other Ambulatory Visit: Payer: Self-pay | Admitting: Internal Medicine

## 2020-03-29 DIAGNOSIS — B009 Herpesviral infection, unspecified: Secondary | ICD-10-CM

## 2020-03-29 MED ORDER — VALACYCLOVIR HCL 500 MG PO TABS: 500.0000 mg | ORAL_TABLET | Freq: Every day | ORAL | 3 refills | Status: DC

## 2020-04-09 ENCOUNTER — Other Ambulatory Visit: Payer: Self-pay

## 2020-04-09 ENCOUNTER — Encounter: Payer: Self-pay | Admitting: Internal Medicine

## 2020-04-09 ENCOUNTER — Telehealth (INDEPENDENT_AMBULATORY_CARE_PROVIDER_SITE_OTHER): Payer: BC Managed Care – PPO | Admitting: Internal Medicine

## 2020-04-09 VITALS — Ht 64.0 in | Wt 161.0 lb

## 2020-04-09 DIAGNOSIS — B3731 Acute candidiasis of vulva and vagina: Secondary | ICD-10-CM

## 2020-04-09 DIAGNOSIS — J0111 Acute recurrent frontal sinusitis: Secondary | ICD-10-CM | POA: Diagnosis not present

## 2020-04-09 DIAGNOSIS — B373 Candidiasis of vulva and vagina: Secondary | ICD-10-CM

## 2020-04-09 MED ORDER — FLUCONAZOLE 150 MG PO TABS: 150.0000 mg | ORAL_TABLET | Freq: Once | ORAL | 0 refills | Status: AC

## 2020-04-09 MED ORDER — AMOXICILLIN-POT CLAVULANATE 875-125 MG PO TABS: 1.0000 | ORAL_TABLET | Freq: Two times a day (BID) | ORAL | 0 refills | Status: DC

## 2020-04-09 NOTE — Progress Notes (Signed)
Virtual Visit via Video Note  I connected with Courtney Ward  on 04/09/20 at  1:00 PM EDT by a video enabled telemedicine application and verified that I am speaking with the correct person using two identifiers.  Location patient: home Location provider:work or home office Persons participating in the virtual visit: patient, provider  I discussed the limitations of evaluation and management by telemedicine and the availability of in person appointments. The patient expressed understanding and agreed to proceed.   HPI: 1. H/o recurrent sinusitis x 2 weeks c/o yellow discharge nose runny nose, cough which is new, h/a sinus pressure sore throat, ear pressure and pain chills and hot cold spells and weakness not sure if related to menopause. Went to DR last week of 02/2020 and had to have covid 19 testing for this tried or will try Emerge C, warm salt gargles, tylenol mucinex D otc no h/o asthma  ROS: See pertinent positives and negatives per HPI.  Past Medical History:  Diagnosis Date  . Allergy   . Arthritis   . Chicken pox   . Chronic constipation   . Colon polyps   . Complication of anesthesia    per pt report she is "difficult to wake up"  . Connective tissue disease ([place])   . CTS (carpal tunnel syndrome)    right  . CTS (carpal tunnel syndrome)    right   . Endometriosis   . Fibrocystic breast changes   . GERD (gastroesophageal reflux disease)   . Heart murmur    MVP  . HSV infection   . Migraines   . Multinodular goiter   . MVP (mitral valve prolapse)   . Thyroid disease   . Tubal ectopic pregnancy   . Uterine fibroid   . UTI (urinary tract infection)     Past Surgical History:  Procedure Laterality Date  . ABLATION ON ENDOMETRIOSIS  2019  . APPENDECTOMY     2005-2010 dx lap with appendectomy lysis of adhesions removal right twisted paraovarian cyst   . [place]  . COLONOSCOPY WITH PROPOFOL N/A 01/27/2019   Procedure: COLONOSCOPY WITH BIOPSY;   Surgeon: [name], MD;  Location: [place];  Service: Endoscopy;  Laterality: N/A;  . ECTOPIC PREGNANCY SURGERY     2002 surgery to repair   . laparascopy     08/2017 colon attached to bladder 08/2017 h/o endometriosis   . LAPAROSCOPIC OOPHERECTOMY     left 1996  . POLYPECTOMY N/A 01/27/2019   Procedure: POLYPECTOMY;  Surgeon: [name], MD;  Location: [place];  Service: Endoscopy;  Laterality: N/A;  . THYROIDECTOMY     2007  . TOE SURGERY    . TONSILLECTOMY     1986  . TUBAL LIGATION     2001  . UTERINE FIBROID SURGERY     2004    Family History  Problem Relation Age of Onset  . Arthritis Mother   . Depression Mother   . Diabetes Mother   . Heart disease Mother   . Hyperlipidemia Mother   . Hypertension Mother   . Kidney disease Mother   . Stroke Mother   . Miscarriages / [place] Mother   . Arthritis Father   . Diabetes Father   . Heart disease Father   . Hyperlipidemia Father   . Hypertension Father   . Arthritis Sister   . Asthma Sister   . Depression Sister   . Drug abuse Sister   .  Hyperlipidemia Sister   . Hypertension Sister   . Learning disabilities Sister   . Mental illness Sister   . Alcohol abuse Brother   . Arthritis Brother   . COPD Brother   . Depression Brother   . Drug abuse Brother   . Mental illness Brother   . Arthritis Daughter   . Depression Daughter   . Hypertension Daughter   . Asthma Son   . Arthritis Maternal Grandmother   . Asthma Maternal Grandmother   . Cancer Maternal Grandmother        bone  . Depression Maternal Grandmother   . Drug abuse Maternal Grandmother   . Heart disease Maternal Grandmother   . Hyperlipidemia Maternal Grandmother   . Hypertension Maternal Grandmother   . Miscarriages / Stillbirths Maternal Grandmother   . Alcohol abuse Maternal Grandfather   . Arthritis Maternal Grandfather   . Cancer Maternal Grandfather        prostate  . COPD Maternal Grandfather   . COPD  Paternal Grandmother   . Hyperlipidemia Paternal Grandmother   . Hypertension Paternal Grandmother   . Miscarriages / Stillbirths Paternal Grandmother   . Alcohol abuse Brother   . Arthritis Brother   . Depression Brother   . Diabetes Brother   . Drug abuse Brother   . Hyperlipidemia Brother   . Hypertension Brother   . Learning disabilities Brother   . Mental illness Brother   . Alcohol abuse Brother   . Arthritis Brother   . COPD Brother   . Depression Brother   . Diabetes Brother   . Drug abuse Brother   . Hyperlipidemia Brother   . Hypertension Brother   . Breast cancer Cousin 1  . Breast cancer Cousin 35    SOCIAL HX: [facility]   Current Outpatient Medications:  .  Calcium Carbonate (CALCIUM-CARB 600 PO), Take 2 tablets by mouth daily., Disp: , Rfl:  .  Cholecalciferol (VITAMIN D3 GUMMIES PO), Take 5,000 Int'l Units by mouth., Disp: , Rfl:  .  famotidine (PEPCID) 40 MG tablet, Take 1 tablet (40 mg total) by mouth 2 (two) times daily., Disp: 180 tablet, Rfl: 3 .  fexofenadine-pseudoephedrine (ALLEGRA-D 24) 180-240 MG 24 hr tablet, Take 1 tablet by mouth daily., Disp: , Rfl:  .  fluticasone (FLONASE) 50 MCG/ACT nasal spray, Place into both nostrils daily., Disp: , Rfl:  .  hydroxychloroquine (PLAQUENIL) 200 MG tablet, Take 200 mg by mouth 2 (two) times daily. , Disp: , Rfl:  .  levothyroxine (SYNTHROID) 88 MCG tablet, Take 1 tablet (88 mcg total) by mouth daily before breakfast., Disp: 90 tablet, Rfl: 3 .  liothyronine (CYTOMEL) 25 MCG tablet, 1/2 daily., Disp: , Rfl:  .  norethindrone-ethinyl estradiol (LOESTRIN) 1-20 MG-MCG tablet, Take 1 tablet by mouth daily., Disp: , Rfl:  .  valACYclovir (VALTREX) 500 MG tablet, Take 1 tablet (500 mg total) by mouth daily. Prn bid x 3-7 days with outbreak, Disp: 180 tablet, Rfl: 3 .  amoxicillin-clavulanate (AUGMENTIN) 875-125 MG tablet, Take 1 tablet by mouth 2 (two) times daily. X 7-10 days, Disp: 20 tablet, Rfl: 0 .  fluconazole  (DIFLUCAN) 150 MG tablet, Take 1 tablet (150 mg total) by mouth once for 1 dose., Disp: 1 tablet, Rfl: 0  EXAM:  VITALS per patient if applicable:  GENERAL: alert, oriented, appears well and in no acute distress  HEENT: atraumatic, conjunttiva clear, no obvious abnormalities on inspection of external nose and ears  NECK: normal movements of the head  and neck  LUNGS: on inspection no signs of respiratory distress, breathing rate appears normal, no obvious gross SOB, gasping or wheezing  CV: no obvious cyanosis  MS: moves all visible extremities without noticeable abnormality  PSYCH/NEURO: pleasant and cooperative, no obvious depression or anxiety, speech and thought processing grossly intact  ASSESSMENT AND PLAN:  Discussed the following assessment and plan:  Acute recurrent frontal sinusitis - Plan: amoxicillin-clavulanate (AUGMENTIN) 875-125 MG tablet rec get covid 19 test asap Mvt  Note for work   Yeast vaginitis - Plan: fluconazole (DIFLUCAN) 150 MG tablet  -we discussed possible serious and likely etiologies, options for evaluation and workup, limitations of telemedicine visit vs in person visit, treatment, treatment risks and precautions. Pt prefers to treat via telemedicine empirically rather then risking or undertaking an in person visit at this moment. Patient agrees to seek prompt in person care if worsening, new symptoms arise, or if is not improving with treatment.   I discussed the assessment and treatment plan with the patient. The patient was provided an opportunity to ask questions and all were answered. The patient agreed with the plan and demonstrated an understanding of the instructions.   The patient was advised to call back or seek an in-person evaluation if the symptoms worsen or if the condition fails to improve as anticipated.  Time 20 min Delorise Jackson, MD

## 2020-04-09 NOTE — Progress Notes (Signed)
Patient presenting with runny nose, eye drainage, ear pressure/pain, facial pain, throat drainage. No fever. Ongoing for a week and now feels like going into the chest. Having green mucous.   No one around the Patient known to be sick but Patient's niece had a runny nose.

## 2020-04-10 ENCOUNTER — Other Ambulatory Visit: Payer: BC Managed Care – PPO

## 2020-04-10 ENCOUNTER — Other Ambulatory Visit: Payer: Self-pay

## 2020-04-10 DIAGNOSIS — Z20822 Contact with and (suspected) exposure to covid-19: Secondary | ICD-10-CM

## 2020-04-11 ENCOUNTER — Telehealth: Payer: Self-pay | Admitting: Internal Medicine

## 2020-04-11 LAB — SARS-COV-2, NAA 2 DAY TAT

## 2020-04-11 LAB — NOVEL CORONAVIRUS, NAA: SARS-CoV-2, NAA: NOT DETECTED

## 2020-04-11 NOTE — Telephone Encounter (Signed)
Patient called in stated that the amoxicillin-clavulanate (AUGMENTIN) 875-125 MG tablet is giving her really bad diarrhea wanted to know if Dr.Tracy could call her something else in.  She stated that she has been eating with taking it

## 2020-04-15 ENCOUNTER — Other Ambulatory Visit: Payer: Self-pay

## 2020-04-15 MED ORDER — DOXYCYCLINE HYCLATE 100 MG PO TABS: 100.0000 mg | ORAL_TABLET | Freq: Two times a day (BID) | ORAL | 0 refills | Status: DC

## 2020-04-15 NOTE — Telephone Encounter (Signed)
Please advise 

## 2020-04-15 NOTE — Telephone Encounter (Signed)
Call pt Get more info Is she having loose stools or brown liquid diarrhea? Any fever, abdominal pain?  Concern with switching antibiotics is that is put her at higher risk for severe diarrheal infections such as cdiff and antibiotic resistance  Let me know how she is doing and we can go from there.

## 2020-04-15 NOTE — Telephone Encounter (Signed)
I spoke with patient & she stated that congestion was no better. I have sent in doxy for patient #14 for 7 days.. Patient advised to take probiotics that she can buy OTC while she takes antibiotic as well as 2 weeks afterwards. Pt verbalized understanding.

## 2020-04-15 NOTE — Telephone Encounter (Signed)
Patient stopped the Augmentin & diarrhea is now gone. It was liquid & she did also have abdominal pain as well. She said now her sinus infection symptoms were again starting to get worse. She would like another antibiotic called in. Please advise?

## 2020-04-15 NOTE — Telephone Encounter (Signed)
Call pt She may start doxycycline , another abx which is good for sinus infection since congestion is worsening. I have pended medication which you can send  Ensure to take probiotics while on antibiotics and also for 2 weeks after completion. It is important to re-colonize the gut with good bacteria and also to prevent any diarrheal infections associated with antibiotic use.  If her symptoms do not resolve, please ask her to call and make f/u [facility] appt

## 2020-05-28 ENCOUNTER — Other Ambulatory Visit: Payer: Self-pay

## 2020-05-28 ENCOUNTER — Ambulatory Visit
Admission: RE | Admit: 2020-05-28 | Discharge: 2020-05-28 | Disposition: A | Discharge status: Home / Self Care | Payer: BC Managed Care – PPO | Source: Ambulatory Visit | Attending: Internal Medicine | Admitting: Internal Medicine

## 2020-05-28 DIAGNOSIS — R928 Other abnormal and inconclusive findings on diagnostic imaging of breast: Secondary | ICD-10-CM

## 2020-05-28 DIAGNOSIS — N6320 Unspecified lump in the left breast, unspecified quadrant: Secondary | ICD-10-CM

## 2020-07-03 ENCOUNTER — Telehealth: Payer: Self-pay | Admitting: Internal Medicine

## 2020-07-03 DIAGNOSIS — K219 Gastro-esophageal reflux disease without esophagitis: Secondary | ICD-10-CM

## 2020-07-03 MED ORDER — FLUTICASONE PROPIONATE 50 MCG/ACT NA SUSP: 2.0000 | Freq: Every day | NASAL | 1 refills | Status: DC

## 2020-07-03 MED ORDER — FAMOTIDINE 40 MG PO TABS: 40.0000 mg | ORAL_TABLET | Freq: Two times a day (BID) | ORAL | 3 refills | Status: DC

## 2020-07-03 NOTE — Telephone Encounter (Signed)
Pt needs a refill on famotidine (PEPCID) 40 MG tablet and fluticasone (FLONASE) 50 MCG/ACT nasal spray sent to [facility]

## 2020-07-03 NOTE — Addendum Note (Signed)
Addended by: Elpidio Galea T on: 07/03/2020 01:10 PM   Modules accepted: Orders

## 2020-07-29 IMAGING — MG MM DIGITAL DIAGNOSTIC BILAT W/ TOMO W/ CAD
6 of 10 series · 6 of 30 positions shown · non-contrast
Comparison: 04/22/2017 and earlier

CLINICAL DATA: Palpable abnormality in the RIGHT axilla. On January 24, 2018, patient presents to the emergency department with a RIGHT
axillary abscess. Incision and drainage and packing was performed.
The patient was started on antibiotics and the mass gradually
resolved. In mid Rampn, the patient had a similar firm tender mass in
the RIGHT axilla which slowly resolved spontaneously. She is
asymptomatic today.

EXAM:
DIGITAL DIAGNOSTIC RIGHT MAMMOGRAM WITH CAD AND TOMO
ULTRASOUND RIGHT BREAST

[R MLO synth-2D (1 of 2)]
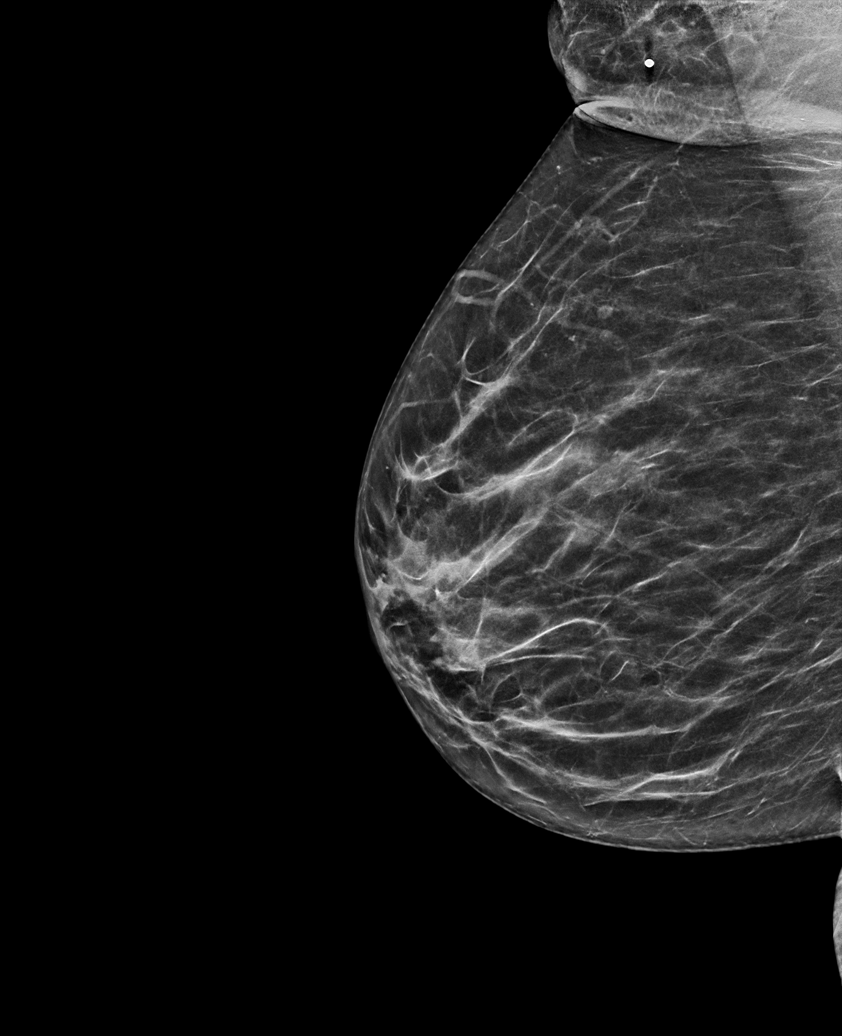

[L CC synth-2D]
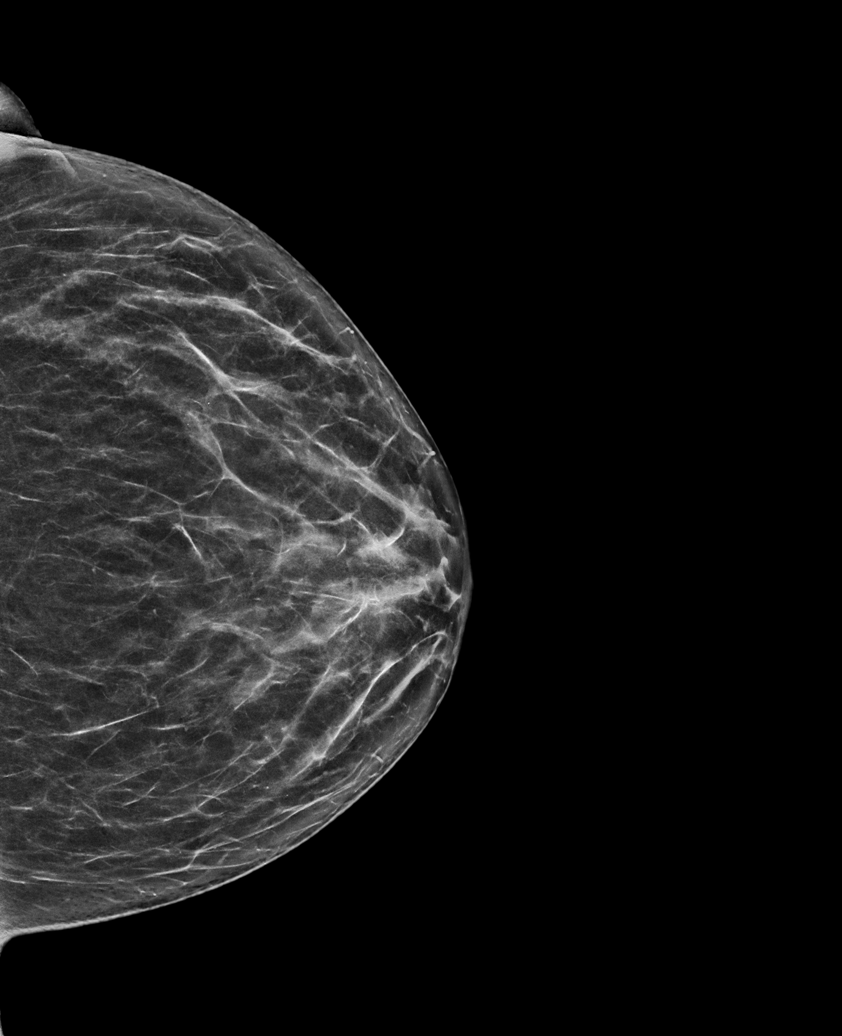

[L MLO synth-2D]
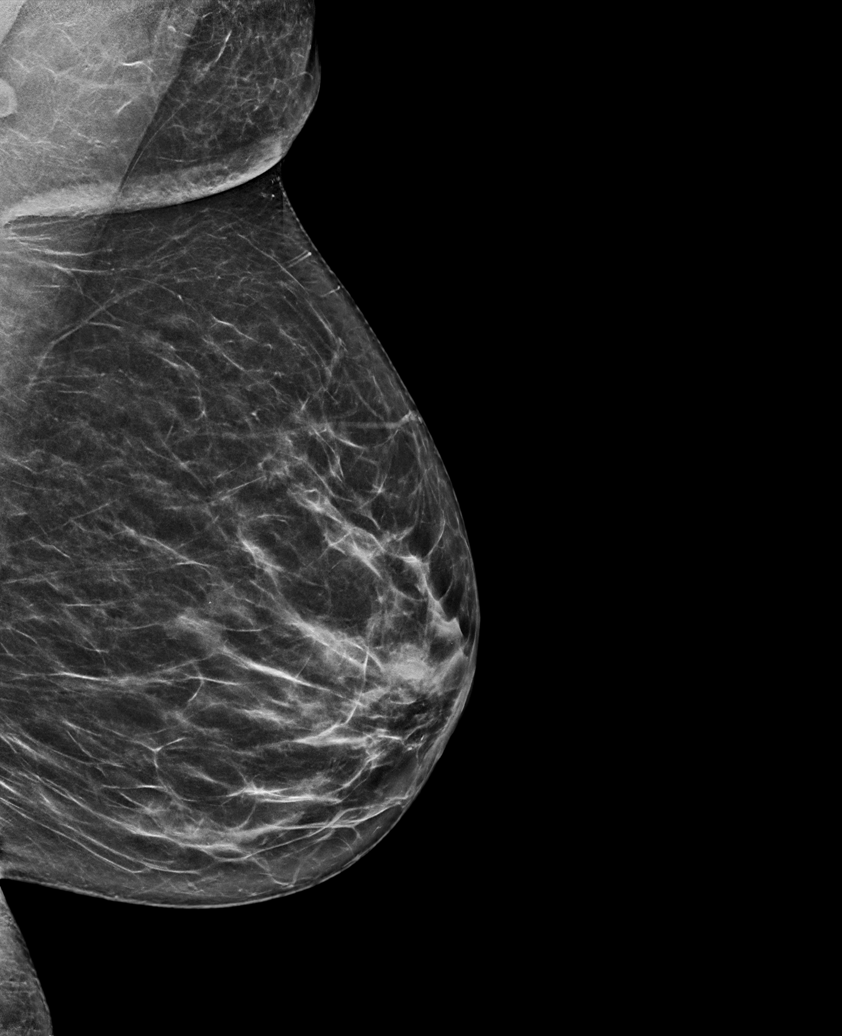

[R CC synth-2D]
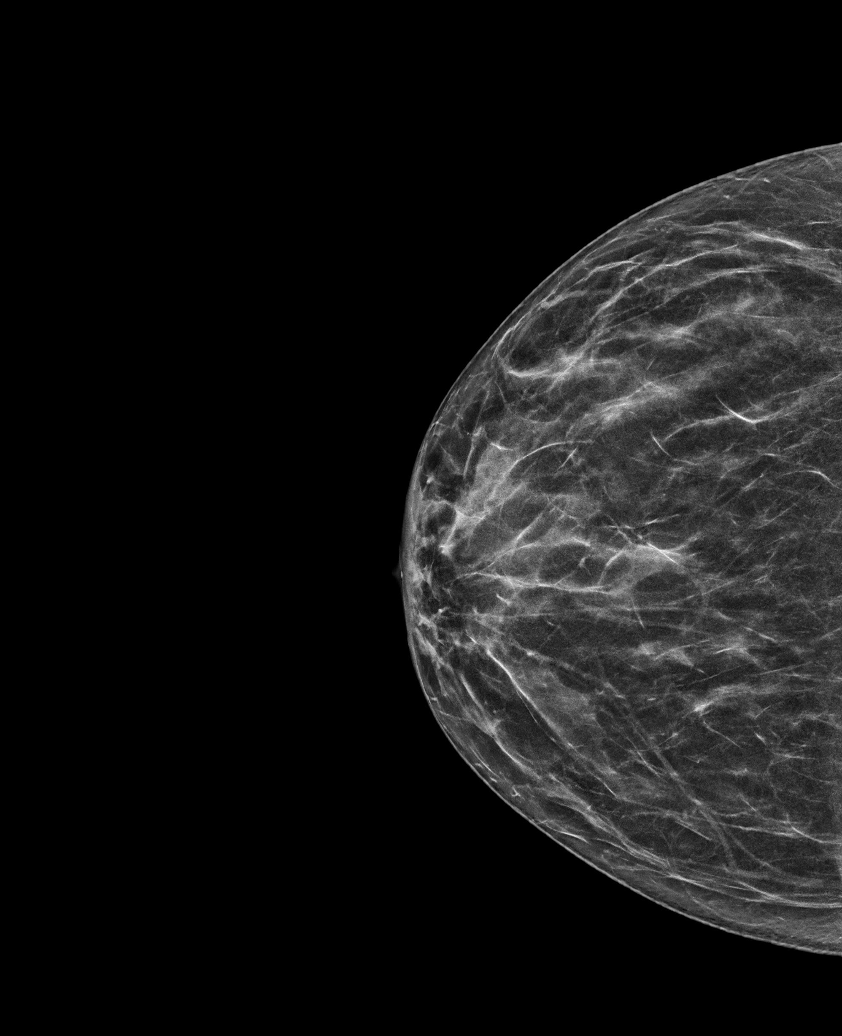

[R MLO synth-2D (2 of 2)]
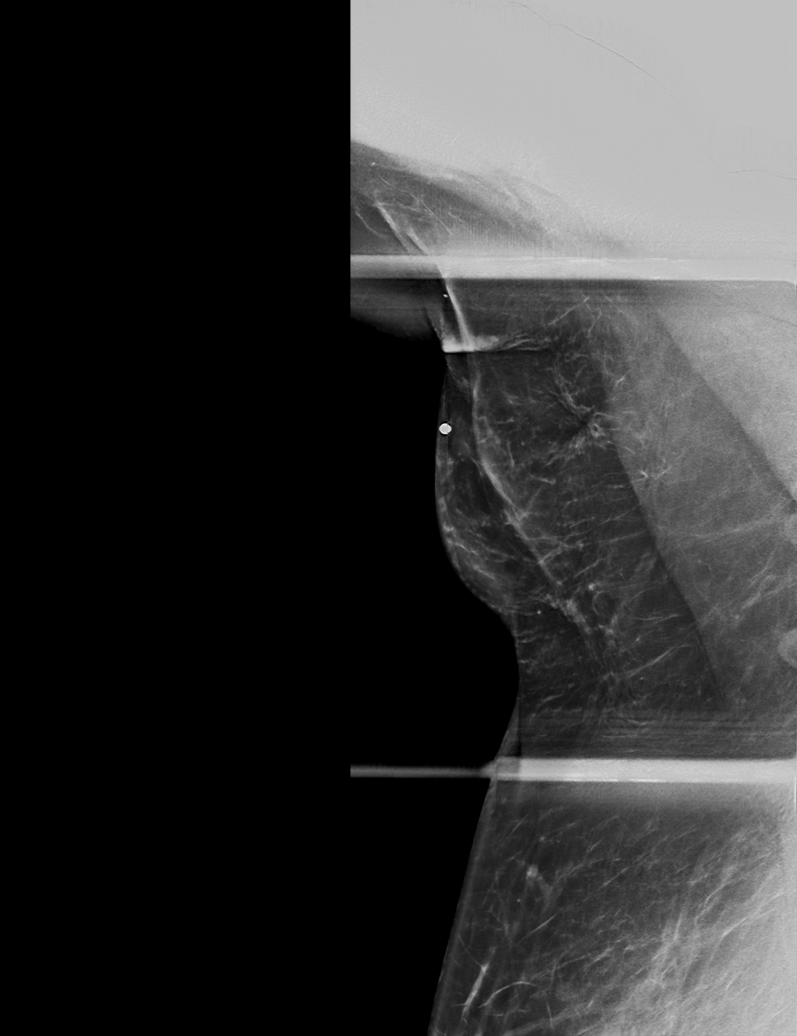

[R MLO tomo · tomo slice 31/61.0]
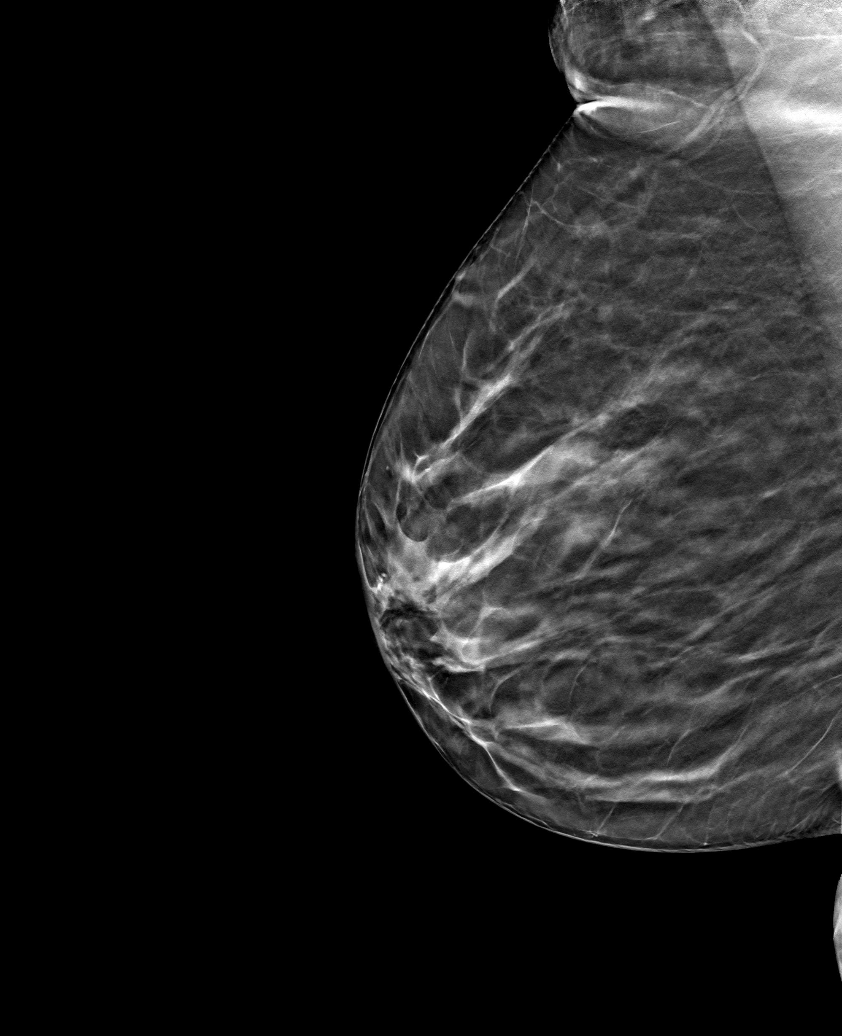

[6 of 30 positions shown; findings below may reference images not displayed]

ACR Breast Density Category b: There are scattered areas of
fibroglandular density.
FINDINGS: No suspicious mass, distortion, or microcalcifications are
identified to suggest presence of malignancy. Spot tangential view
in the RIGHT axilla shows normal appearing fibrofatty tissue.

Mammographic images were processed with CAD.

On physical exam, I palpate no abnormality in the RIGHT axilla.

Targeted ultrasound is performed, showing multiple RIGHT axial lymph
nodes with normal morphology. No mass or acoustic shadowing
identified.
IMPRESSION: No mammographic or ultrasound evidence for malignancy.

Interval resolution of RIGHT axillary abscess.  No adenopathy.

RECOMMENDATION:
Screening mammogram in one year.(Code:[id number])

I have discussed the findings and recommendations with the patient.
Results were also provided in writing at the conclusion of the
visit. If applicable, a reminder letter will be sent to the patient
regarding the next appointment.

BI-RADS CATEGORY  1: Negative.

## 2020-08-09 ENCOUNTER — Encounter: Payer: Self-pay | Admitting: Internal Medicine

## 2020-08-09 ENCOUNTER — Ambulatory Visit: Payer: BC Managed Care – PPO | Admitting: Internal Medicine

## 2020-08-09 ENCOUNTER — Other Ambulatory Visit: Payer: Self-pay

## 2020-08-09 ENCOUNTER — Ambulatory Visit (INDEPENDENT_AMBULATORY_CARE_PROVIDER_SITE_OTHER): Payer: BC Managed Care – PPO | Admitting: Internal Medicine

## 2020-08-09 VITALS — BP 126/78 | HR 67 | Temp 98.2°F | Ht 62.84 in | Wt 165.0 lb

## 2020-08-09 DIAGNOSIS — N63 Unspecified lump in unspecified breast: Secondary | ICD-10-CM

## 2020-08-09 DIAGNOSIS — E538 Deficiency of other specified B group vitamins: Secondary | ICD-10-CM | POA: Diagnosis not present

## 2020-08-09 DIAGNOSIS — Z0000 Encounter for general adult medical examination without abnormal findings: Secondary | ICD-10-CM

## 2020-08-09 DIAGNOSIS — N644 Mastodynia: Secondary | ICD-10-CM | POA: Diagnosis not present

## 2020-08-09 DIAGNOSIS — E890 Postprocedural hypothyroidism: Secondary | ICD-10-CM

## 2020-08-09 DIAGNOSIS — R2231 Localized swelling, mass and lump, right upper limb: Secondary | ICD-10-CM | POA: Diagnosis not present

## 2020-08-09 DIAGNOSIS — R928 Other abnormal and inconclusive findings on diagnostic imaging of breast: Secondary | ICD-10-CM

## 2020-08-09 NOTE — Patient Instructions (Addendum)
Consider breast surgery Dr. Barry Dienes in [place] or Dr. Freddi Che in [place] or [place] -let me know   Giovannis Tea Tree shampoo and conditioner  Skin hair and nail vitamin    Results for ANYLA, ISRAELSONDEE" (MRN [id number]) as of 08/09/2020 15:57  Ref. Range 03/09/2019 08:28  Vitamin D, 25-Hydroxy Latest Ref Range: 30.0 - 100.0 ng/mL 25.5 (L)    Dermatosis papulosis nigra hair loss  -Dr. Lois Huxley [facility] in [place], [place] office  Dr. Lanier Prude in [facility]    Vitamin B12 Deficiency Vitamin B12 deficiency occurs when the body does not have enough vitamin B12, which is an important vitamin. The body needs this vitamin:  To make red blood cells.  To make DNA. This is the genetic material inside cells.  To help the nerves work properly so they can carry messages from the brain to the body. Vitamin B12 deficiency can cause various health problems, such as a low red blood cell count (anemia) or nerve damage. What are the causes? This condition may be caused by:  Not eating enough foods that contain vitamin B12.  Not having enough stomach acid and digestive fluids to properly absorb vitamin B12 from the food that you eat.  Certain digestive system diseases that make it hard to absorb vitamin B12. These diseases include Crohn's disease, chronic pancreatitis, and cystic fibrosis.  A condition in which the body does not make enough of a protein (intrinsic factor), resulting in too few red blood cells (pernicious anemia).  Having a surgery in which part of the stomach or small intestine is removed.  Taking certain medicines that make it hard for the body to absorb vitamin B12. These medicines include: ? Heartburn medicines (antacids and proton pump inhibitors). ? Certain antibiotic medicines. ? Some medicines that are used to treat diabetes, tuberculosis, gout, or high cholesterol. What increases the risk? The following factors may make you more likely to develop a  B12 deficiency:  Being older than age 32.  Eating a vegetarian or vegan diet, especially while you are pregnant.  Eating a poor diet while you are pregnant.  Taking certain medicines.  Having alcoholism. What are the signs or symptoms? In some cases, there are no symptoms of this condition. If the condition leads to anemia or nerve damage, various symptoms can occur, such as:  Weakness.  Fatigue.  Loss of appetite.  Weight loss.  Numbness or tingling in your hands and feet.  Redness and burning of the tongue.  Confusion or memory problems.  Depression.  Sensory problems, such as color blindness, ringing in the ears, or loss of taste.  Diarrhea or constipation.  Trouble walking. If anemia is severe, symptoms can include:  Shortness of breath.  Dizziness.  Rapid heart rate (tachycardia). How is this diagnosed? This condition may be diagnosed with a blood test to measure the level of vitamin B12 in your blood. You may also have other tests, including:  A group of tests that measure certain characteristics of blood cells (complete blood count, CBC).  A blood test to measure intrinsic factor.  A procedure where a thin tube with a camera on the end is used to look into your stomach or intestines (endoscopy). Other tests may be needed to discover the cause of B12 deficiency. How is this treated? Treatment for this condition depends on the cause. This condition may be treated by:  Changing your eating and drinking habits, such as: ? Eating more foods that contain vitamin B12. ?  Drinking less alcohol or no alcohol.  Getting vitamin B12 injections.  Taking vitamin B12 supplements. Your health care provider will tell you which dosage is best for you. Follow these instructions at home: Eating and drinking   Eat lots of healthy foods that contain vitamin B12, including: ? Meats and poultry. This includes beef, pork, chicken, [place], and organ meats, such as  liver. ? Seafood. This includes clams, rainbow trout, salmon, tuna, and haddock. ? Eggs. ? Cereal and dairy products that are fortified. This means that vitamin B12 has been added to the food. Check the label on the package to see if the food is fortified. The items listed above may not be a complete list of recommended foods and beverages. Contact a dietitian for more information. General instructions  Get any injections that are prescribed by your health care provider.  Take supplements only as told by your health care provider. Follow the directions carefully.  Do not drink alcohol if your health care provider tells you not to. In some cases, you may only be asked to limit alcohol use.  Keep all follow-up visits as told by your health care provider. This is important. Contact a health care provider if:  Your symptoms come back. Get help right away if you:  Develop shortness of breath.  Have a rapid heart rate.  Have chest pain.  Become dizzy or lose consciousness. Summary  Vitamin B12 deficiency occurs when the body does not have enough vitamin B12.  The main causes of vitamin B12 deficiency include dietary deficiency, digestive diseases, pernicious anemia, and having a surgery in which part of the stomach or small intestine is removed.  In some cases, there are no symptoms of this condition. If the condition leads to anemia or nerve damage, various symptoms can occur, such as weakness, shortness of breath, and numbness.  Treatment may include getting vitamin B12 injections or taking vitamin B12 supplements. Eat lots of healthy foods that contain vitamin B12. This information is not intended to replace advice given to you by your health care provider. Make sure you discuss any questions you have with your health care provider. Document Revised: 01/27/2019 Document Reviewed: 04/19/2018 Elsevier Patient Education  [place].  Zoster Vaccine, Recombinant  injection What is this medicine? ZOSTER VACCINE (ZOS ter vak SEEN) is used to prevent shingles in adults 56 years old and over. This vaccine is not used to treat shingles or nerve pain from shingles. This medicine may be used for other purposes; ask your health care provider or pharmacist if you have questions. COMMON BRAND NAME(S): SHINGRIX x 2 doses   What should I tell my health care provider before I take this medicine? They need to know if you have any of these conditions:  blood disorders or disease  cancer like leukemia or lymphoma  immune system problems or therapy  an unusual or allergic reaction to vaccines, other medications, foods, dyes, or preservatives  pregnant or trying to get pregnant  breast-feeding How should I use this medicine? This vaccine is for injection in a muscle. It is given by a health care professional. Talk to your pediatrician regarding the use of this medicine in children. This medicine is not approved for use in children. Overdosage: If you think you have taken too much of this medicine contact a poison control center or emergency room at once. NOTE: This medicine is only for you. Do not share this medicine with others. What if I miss a dose? Keep  appointments for follow-up (booster) doses as directed. It is important not to miss your dose. Call your doctor or health care professional if you are unable to keep an appointment. What may interact with this medicine?  medicines that suppress your immune system  medicines to treat cancer  steroid medicines like prednisone or cortisone This list may not describe all possible interactions. Give your health care provider a list of all the medicines, herbs, non-prescription drugs, or dietary supplements you use. Also tell them if you smoke, drink alcohol, or use illegal drugs. Some items may interact with your medicine. What should I watch for while using this medicine? Visit your doctor for regular check  ups. This vaccine, like all vaccines, may not fully protect everyone. What side effects may I notice from receiving this medicine? Side effects that you should report to your doctor or health care professional as soon as possible:  allergic reactions like skin rash, itching or hives, swelling of the face, lips, or tongue  breathing problems Side effects that usually do not require medical attention (report these to your doctor or health care professional if they continue or are bothersome):  chills  headache  fever  nausea, vomiting  redness, warmth, pain, swelling or itching at site where injected  tiredness This list may not describe all possible side effects. Call your doctor for medical advice about side effects. You may report side effects to FDA at 1-800-FDA-1088. Where should I keep my medicine? This vaccine is only given in a clinic, pharmacy, doctor's office, or other health care setting and will not be stored at home. NOTE: This sheet is a summary. It may not cover all possible information. If you have questions about this medicine, talk to your doctor, pharmacist, or health care provider.  2020 Elsevier/Gold Standard (2017-03-22 13:20:30)

## 2020-08-09 NOTE — Progress Notes (Addendum)
Chief Complaint  Patient presents with  . Annual Exam  . Breast Problem    Right side breat soreness and discomfort    Annual  1. Doing well other than right breast soreness 8-10 oclock x 2 weeks not sure if due to bra rubbing will order [place] mammogram thought benign left breast mass and b/l dx mammo and left breast US due 3/22  2. Labs reviewed 05/13/20 [facility] B12 def 182, vitamin D 49.8 cmet, cbc, lipid, tsh, ua, TSH  3. Hypothyroidism s/p removal on levo 88 mcg qd and cytomel 12.5 mg qd  4. B12 def level 182 05/13/20 getting shots [facility] Q2 weeks for now unable to give to herselt   Review of Systems  Constitutional: Negative for weight loss.  HENT: Negative for hearing loss.   Eyes: Negative for blurred vision.  Respiratory: Negative for shortness of breath.   Cardiovascular: Negative for chest pain.  Gastrointestinal: Negative for abdominal pain.  Skin: Negative for rash.  Neurological: Negative for headaches.  Psychiatric/Behavioral: Negative for depression and memory loss.   Past Medical History:  Diagnosis Date  . Allergy   . Arthritis   . Chicken pox   . Chronic constipation   . Colon polyps   . Complication of anesthesia    per pt report she is "difficult to wake up"  . Connective tissue disease ([place])   . CTS (carpal tunnel syndrome)    right  . CTS (carpal tunnel syndrome)    right   . Endometriosis   . Fibrocystic breast changes   . GERD (gastroesophageal reflux disease)   . Heart murmur    MVP  . HSV infection   . Migraines   . Multinodular goiter   . MVP (mitral valve prolapse)   . Thyroid disease   . Tubal ectopic pregnancy   . Uterine fibroid   . UTI (urinary tract infection)    Past Surgical History:  Procedure Laterality Date  . ABLATION ON ENDOMETRIOSIS  2019  . APPENDECTOMY     2005-2010 dx lap with appendectomy lysis of adhesions removal right twisted paraovarian cyst   . [place]  . COLONOSCOPY WITH PROPOFOL N/A  01/27/2019   Procedure: COLONOSCOPY WITH BIOPSY;  Surgeon: [name], MD;  Location: [place];  Service: Endoscopy;  Laterality: N/A;  . ECTOPIC PREGNANCY SURGERY     2002 surgery to repair   . laparascopy     08/2017 colon attached to bladder 08/2017 h/o endometriosis   . LAPAROSCOPIC OOPHERECTOMY     left 1996  . POLYPECTOMY N/A 01/27/2019   Procedure: POLYPECTOMY;  Surgeon: [name], MD;  Location: [place];  Service: Endoscopy;  Laterality: N/A;  . THYROIDECTOMY     2007  . TOE SURGERY    . TONSILLECTOMY     1986  . TUBAL LIGATION     2001  . UTERINE FIBROID SURGERY     2004   Family History  Problem Relation Age of Onset  . Arthritis Mother   . Depression Mother   . Diabetes Mother   . Heart disease Mother   . Hyperlipidemia Mother   . Hypertension Mother   . Kidney disease Mother   . Stroke Mother   . Miscarriages / [place] Mother   . Arthritis Father   . Diabetes Father   . Heart disease Father   . Hyperlipidemia Father   . Hypertension Father   . Arthritis Sister   .  Asthma Sister   . Depression Sister   . Drug abuse Sister   . Hyperlipidemia Sister   . Hypertension Sister   . Learning disabilities Sister   . Mental illness Sister   . Alcohol abuse Brother   . Arthritis Brother   . COPD Brother   . Depression Brother   . Drug abuse Brother   . Mental illness Brother   . Arthritis Daughter   . Depression Daughter   . Hypertension Daughter   . Asthma Son   . Arthritis Maternal Grandmother   . Asthma Maternal Grandmother   . Cancer Maternal Grandmother        bone  . Depression Maternal Grandmother   . Drug abuse Maternal Grandmother   . Heart disease Maternal Grandmother   . Hyperlipidemia Maternal Grandmother   . Hypertension Maternal Grandmother   . Miscarriages / Stillbirths Maternal Grandmother   . Alcohol abuse Maternal Grandfather   . Arthritis Maternal Grandfather   . Cancer Maternal Grandfather        prostate   . COPD Maternal Grandfather   . COPD Paternal Grandmother   . Hyperlipidemia Paternal Grandmother   . Hypertension Paternal Grandmother   . Miscarriages / Stillbirths Paternal Grandmother   . Alcohol abuse Brother   . Arthritis Brother   . Depression Brother   . Diabetes Brother   . Drug abuse Brother   . Hyperlipidemia Brother   . Hypertension Brother   . Learning disabilities Brother   . Mental illness Brother   . Alcohol abuse Brother   . Arthritis Brother   . COPD Brother   . Depression Brother   . Diabetes Brother   . Drug abuse Brother   . Hyperlipidemia Brother   . Hypertension Brother   . Breast cancer Cousin 52  . Breast cancer Cousin 55   Social History   Socioeconomic History  . Marital status: Divorced    Spouse name: Not on file  . Number of children: Not on file  . Years of education: Not on file  . Highest education level: Not on file  Occupational History  . Not on file  Tobacco Use  . Smoking status: Former Research scientist (life sciences)  . Smokeless tobacco: Never Used  . Tobacco comment: from age 3 to 52 3-4 cig maternal side lung cancer   Substance and Sexual Activity  . Alcohol use: Not Currently  . Drug use: Not Currently  . Sexual activity: Yes    Comment: men  Other Topics Concern  . 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 Calcium Carbonate (CALCIUM-CARB 600 PO) Take 2 tablets by mouth daily.  . Cholecalciferol (VITAMIN D3 GUMMIES PO) Take 5,000 Int'l Units by mouth.  . cyanocobalamin  (,VITAMIN B-12,) 1000 MCG/ML injection Inject into the muscle.  . famotidine (PEPCID) 40 MG tablet Take 1 tablet (40 mg total) by mouth 2 (two) times daily.  . fexofenadine-pseudoephedrine (ALLEGRA-D 24) 180-240 MG 24  hr tablet Take 1 tablet by mouth daily.  . fluticasone (FLONASE) 50 MCG/ACT nasal spray Place 2 sprays into both nostrils daily.  . hydroxychloroquine (PLAQUENIL) 200 MG tablet Take 200 mg by mouth 2 (two) times daily.   Marland Kitchen levothyroxine (SYNTHROID) 88 MCG tablet Take 1 tablet (88 mcg total) by mouth daily before breakfast.  . liothyronine (CYTOMEL) 25 MCG tablet 1/2 daily.  . norethindrone-ethinyl estradiol (LOESTRIN) 1-20 MG-MCG tablet Take 1 tablet by mouth daily.  . valACYclovir (VALTREX) 500 MG tablet Take 1 tablet (500 mg total) by mouth daily. Prn bid x 3-7 days with outbreak   Allergies  Allergen Reactions  . Cortisone   . Sulfa Antibiotics Hives    Rash    . Tramadol Other (See Comments)    Dizzy and confused. Stays in system for days incapacitated    . Codeine Rash    Nausea, cant move    No results found for this or any previous visit (from the past 2160 hour(s)). Objective  Body mass index is 29.38 kg/m. Wt Readings from Last 3 Encounters:  08/09/20 165 lb (74.8 kg)  04/09/20 161 lb (73 kg)  07/05/19 171 lb 6.4 oz (77.7 kg)   Temp Readings from Last 3 Encounters:  08/09/20 98.2 F (36.8 C) (Oral)  07/05/19 97.6 F (36.4 C) (Skin)  01/27/19 (!) 97.3 F (36.3 C)   BP Readings from Last 3 Encounters:  08/09/20 126/78  07/05/19 116/72  01/27/19 120/76   Pulse Readings from Last 3 Encounters:  08/09/20 67  07/05/19 71  01/27/19 (!) 58    Physical Exam Vitals and nursing note reviewed.  Constitutional:      Appearance: Normal appearance. She is well-developed, well-groomed and overweight.  HENT:     Head: Normocephalic and atraumatic.  Eyes:     Conjunctiva/sclera: Conjunctivae normal.     Pupils: Pupils are equal, round, and reactive to  light.  Cardiovascular:     Rate and Rhythm: Normal rate and regular rhythm.     Heart sounds: Normal heart sounds. No murmur heard.   Pulmonary:     Effort: Pulmonary effort is normal.     Breath sounds: Normal breath sounds.  Chest:     Chest wall: No mass.  Breasts: Breasts are symmetrical.     Right: Tenderness present. No axillary adenopathy.     Left: Normal. No axillary adenopathy.        Comments: 8-10 oclock breast soreness  Abdominal:     Tenderness: There is no abdominal tenderness.  Lymphadenopathy:     Upper Body:     Right upper body: No axillary adenopathy.     Left upper body: No axillary adenopathy.  Skin:    General: Skin is warm and dry.  Neurological:     General: No focal deficit present.     Mental Status: She is alert and oriented to person, place, and time. Mental status is at baseline.     Gait: Gait normal.  Psychiatric:        Attention and Perception: Attention and perception normal.        Mood and Affect: Mood and affect normal.        Speech: Speech normal.        Behavior: Behavior normal. Behavior is cooperative.        Thought Content: Thought content normal.        Cognition and Memory: Cognition and memory normal.        Judgment: Judgment  normal.     Assessment  Plan  Annual physical exam Flu shot utd  3/3 covid vaccines Tdap 10/2019 had with new job at [employer] per pt Consider shingrix vaccine given Rx today  consider hep B titer in future had 2/3 shots pending 3rd (ask pt at f/u in 2021 if had 3rd hep B shot) -per pt titer checked protected at [place] job Hep C negative HIV and Hep c neg MMR immune   +cervical polyp and female pelvic pain refer Dr. Blima Rich Ward and do cT ab/pelvis with contrast -established with Dr. Leonides Schanz ob/gyn pap 07/01/18 neg pap neg HPV -vaginal spotting on OCP will inform Dr. Leonides Schanz  ROI copy of procedure 05/13/20 fibroids   mammo 8/19/19ordered repeatpt to call and schedule breast exam normal today   05/28/20 left breast benign mass   Colonoscopy had 2014 h/o colon polyps prior PCP - 01/27/19 tubular adenoma f/u 5 years [facility]  HSV 1/2 + Former smoker age 65/14 4 cig qd quit age 23 y.o  H/o fibroids noted 04/29/11 pelvic US and right ovary cyst  rec healthy diet choices and exercise [facility]  Dr. Satira Sark ob/gy n  Mass of right axilla - Plan: US BREAST LTD UNI RIGHT INC AXILLA,  Breast pain, right - Plan: US BREAST LTD UNI RIGHT INC AXILLA -repeat dx mammo 10/2020 b/l and left breast US  Consider breast surgery [facility]   B12 deficiency On Q2 weeks B12 shots [facility]   Postoperative hypothyroidism  Levo 88 mcg and cytomel 12.5  [facility]   Provider: Dr. Olivia Mackie McLean-Scocuzza-Internal Medicine

## 2020-08-12 NOTE — Addendum Note (Signed)
Addended by: Orland Mustard on: 08/12/2020 04:34 PM   Modules accepted: Orders

## 2020-09-24 ENCOUNTER — Ambulatory Visit
Admission: EM | Admit: 2020-09-24 | Discharge: 2020-09-24 | Disposition: A | Discharge status: Home / Self Care | Payer: BC Managed Care – PPO | Attending: Internal Medicine | Admitting: Internal Medicine

## 2020-09-24 ENCOUNTER — Other Ambulatory Visit: Payer: Self-pay

## 2020-09-24 DIAGNOSIS — J0100 Acute maxillary sinusitis, unspecified: Secondary | ICD-10-CM

## 2020-09-24 DIAGNOSIS — Z20822 Contact with and (suspected) exposure to covid-19: Secondary | ICD-10-CM | POA: Diagnosis not present

## 2020-09-24 MED ORDER — AMOXICILLIN-POT CLAVULANATE 875-125 MG PO TABS: 1.0000 | ORAL_TABLET | Freq: Two times a day (BID) | ORAL | 0 refills | Status: DC

## 2020-09-24 MED ORDER — PREDNISONE 20 MG PO TABS: 20.0000 mg | ORAL_TABLET | Freq: Every day | ORAL | 0 refills | Status: DC

## 2020-09-24 NOTE — ED Provider Notes (Signed)
Courtney Ward    CSN: [id number] Arrival date & time: 09/24/20  1341      History   Chief Complaint Chief Complaint  Patient presents with  . Cough  . Headache    HPI Courtney Ward is a 55 y.o. female who presents with sinus pressure x 2 weeks. Has productive cough with yellow/green mcous. Has had mild nausea for the past few days. Has taken Tylenol, Alka seltzer cold and night, Flonase but they have not helped. Had a negative home covid test 5 days ago.  Denies hx of sinus surgeries. Has not done saline rinses.    Past Medical History:  Diagnosis Date  . Allergy   . Arthritis   . Chicken pox   . Chronic constipation   . Colon polyps   . Complication of anesthesia    per pt report she is "difficult to wake up"  . Connective tissue disease ([place])   . CTS (carpal tunnel syndrome)    right  . CTS (carpal tunnel syndrome)    right   . Endometriosis   . Fibrocystic breast changes   . GERD (gastroesophageal reflux disease)   . Heart murmur    MVP  . HSV infection   . Migraines   . Multinodular goiter   . MVP (mitral valve prolapse)   . Thyroid disease   . Tubal ectopic pregnancy   . Uterine fibroid   . UTI (urinary tract infection)     Patient Active Problem List   Diagnosis Date Noted  . B12 deficiency 08/09/2020  . Personal history of colonic polyps   . Benign neoplasm of cecum   . Trigger finger of right thumb 12/30/2018  . Annual physical exam 07/01/2018  . Endometriosis 07/01/2018  . Female pelvic pain 07/01/2018  . Cervical polyp 07/01/2018  . Mass of right axilla 03/15/2018  . Prediabetes 03/15/2018  . HSV infection 03/15/2018  . Hypothyroidism 03/08/2018  . MCTD (mixed connective tissue disease) ([place]) 03/08/2018  . MVP (mitral valve prolapse) 03/08/2018  . History of migraine 03/08/2018  . GERD (gastroesophageal reflux disease) 03/08/2018    Past Surgical History:  Procedure Laterality Date  . ABLATION ON ENDOMETRIOSIS  2019  .  APPENDECTOMY     2005-2010 dx lap with appendectomy lysis of adhesions removal right twisted paraovarian cyst   . Courtney Ward  . COLONOSCOPY WITH PROPOFOL N/A 01/27/2019   Procedure: COLONOSCOPY WITH BIOPSY;  Surgeon: [name], MD;  Location: [place];  Service: Endoscopy;  Laterality: N/A;  . ECTOPIC PREGNANCY SURGERY     2002 surgery to repair   . laparascopy     08/2017 colon attached to bladder 08/2017 h/o endometriosis   . LAPAROSCOPIC OOPHERECTOMY     left 1996  . POLYPECTOMY N/A 01/27/2019   Procedure: POLYPECTOMY;  Surgeon: [name], MD;  Location: [place];  Service: Endoscopy;  Laterality: N/A;  . THYROIDECTOMY     2007  . TOE SURGERY    . TONSILLECTOMY     1986  . TUBAL LIGATION     2001  . UTERINE FIBROID SURGERY     2004    OB History   No obstetric history on file.      Home Medications    Prior to Admission medications   Medication Sig Start Date End Date Taking? Authorizing Provider  amoxicillin-clavulanate (AUGMENTIN) 875-125 MG tablet Take 1 tablet by mouth 2 (two) times daily. 09/24/20  Yes  Rodriguez-Southworth, Sunday Spillers, PA-C  Calcium Carbonate (CALCIUM-CARB 600 PO) Take 2 tablets by mouth daily.   Yes [provider]  Cholecalciferol (VITAMIN D3 GUMMIES PO) Take 5,000 Int'l Units by mouth.   Yes [provider]  cyanocobalamin (,VITAMIN B-12,) 1000 MCG/ML injection Inject into the muscle. 05/14/20  Yes [provider]  famotidine (PEPCID) 40 MG tablet Take 1 tablet (40 mg total) by mouth 2 (two) times daily. 07/03/20  Yes McLean-Scocuzza, Nino Glow, MD  fexofenadine-pseudoephedrine (ALLEGRA-D 24) 180-240 MG 24 hr tablet Take 1 tablet by mouth daily.   Yes [provider]  fluticasone (FLONASE) 50 MCG/ACT nasal spray Place 2 sprays into both nostrils daily. 07/03/20  Yes McLean-Scocuzza, Nino Glow, MD  hydroxychloroquine (PLAQUENIL) 200 MG tablet Take 200 mg by mouth 2 (two) times daily.   11/09/11  Yes [provider]  levothyroxine (SYNTHROID) 88 MCG tablet Take 1 tablet (88 mcg total) by mouth daily before breakfast. 12/30/18  Yes McLean-Scocuzza, Nino Glow, MD  liothyronine (CYTOMEL) 25 MCG tablet 1/2 daily. 11/23/17  Yes [provider]  norethindrone-ethinyl estradiol (LOESTRIN) 1-20 MG-MCG tablet Take 1 tablet by mouth daily.   Yes [provider]  predniSONE (DELTASONE) 20 MG tablet Take 1 tablet (20 mg total) by mouth daily with breakfast. 09/24/20  Yes Rodriguez-Southworth, Sunday Spillers, PA-C  valACYclovir (VALTREX) 500 MG tablet Take 1 tablet (500 mg total) by mouth daily. Prn bid x 3-7 days with outbreak 03/29/20  Yes McLean-Scocuzza, Nino Glow, MD    Family History Family History  Problem Relation Age of Onset  . Arthritis Mother   . Depression Mother   . Diabetes Mother   . Heart disease Mother   . Hyperlipidemia Mother   . Hypertension Mother   . Kidney disease Mother   . Stroke Mother   . Miscarriages / [place] Mother   . Arthritis Father   . Diabetes Father   . Heart disease Father   . Hyperlipidemia Father   . Hypertension Father   . Arthritis Sister   . Asthma Sister   . Depression Sister   . Drug abuse Sister   . Hyperlipidemia Sister   . Hypertension Sister   . Learning disabilities Sister   . Mental illness Sister   . Alcohol abuse Brother   . Arthritis Brother   . COPD Brother   . Depression Brother   . Drug abuse Brother   . Mental illness Brother   . Arthritis Daughter   . Depression Daughter   . Hypertension Daughter   . Asthma Son   . Arthritis Maternal Grandmother   . Asthma Maternal Grandmother   . Cancer Maternal Grandmother        bone  . Depression Maternal Grandmother   . Drug abuse Maternal Grandmother   . Heart disease Maternal Grandmother   . Hyperlipidemia Maternal Grandmother   . Hypertension Maternal Grandmother   . Miscarriages / Stillbirths Maternal Grandmother   . Alcohol abuse Maternal  Grandfather   . Arthritis Maternal Grandfather   . Cancer Maternal Grandfather        prostate  . COPD Maternal Grandfather   . COPD Paternal Grandmother   . Hyperlipidemia Paternal Grandmother   . Hypertension Paternal Grandmother   . Miscarriages / Stillbirths Paternal Grandmother   . Alcohol abuse Brother   . Arthritis Brother   . Depression Brother   . Diabetes Brother   . Drug abuse Brother   . Hyperlipidemia Brother   . Hypertension Brother   .  Learning disabilities Brother   . Mental illness Brother   . Alcohol abuse Brother   . Arthritis Brother   . COPD Brother   . Depression Brother   . Diabetes Brother   . Drug abuse Brother   . Hyperlipidemia Brother   . Hypertension Brother   . Breast cancer Cousin 28  . Breast cancer Cousin 27    Social History Social History   Tobacco Use  . Smoking status: Former Research scientist (life sciences)  . Smokeless tobacco: Never Used  . Tobacco comment: from age 44 to 8 3-4 cig maternal side lung cancer   Substance Use Topics  . Alcohol use: Not Currently  . Drug use: Not Currently     Allergies   Cortisone, Sulfa antibiotics, Tramadol, and Codeine   Review of Systems Review of Systems  Constitutional: Positive for fatigue. Negative for activity change, appetite change, chills, diaphoresis and fever.  HENT: Positive for congestion, postnasal drip, sinus pressure and sinus pain. Negative for dental problem, ear discharge, ear pain, rhinorrhea and sore throat.   Eyes: Negative for discharge.  Respiratory: Positive for cough. Negative for choking and shortness of breath.   Cardiovascular: Negative for chest pain.  Gastrointestinal: Positive for nausea. Negative for diarrhea and vomiting.  Musculoskeletal: Negative for gait problem and myalgias.  Skin: Negative for wound.  Neurological: Negative for headaches.  Hematological: Negative for adenopathy.     Physical Exam Triage Vital Signs ED Triage Vitals  Enc Vitals Group     BP 09/24/20  1407 123/76     Pulse Rate 09/24/20 1407 69     Resp 09/24/20 1407 16     Temp 09/24/20 1407 99.4 F (37.4 C)     Temp Source 09/24/20 1407 Oral     SpO2 09/24/20 1407 98 %     Weight 09/24/20 1404 165 lb (74.8 kg)     Height 09/24/20 1404 5\' 3"  (1.6 m)     Head Circumference --      Peak Flow --      Pain Score 09/24/20 1402 8     Pain Loc --      Pain Edu? --      Excl. in [place]? --    No data found.  Updated Vital Signs BP 123/76 (BP Location: Left Arm)   Pulse 69   Temp 99.4 F (37.4 C) (Oral)   Resp 16   Ht 5\' 3"  (1.6 m)   Wt 165 lb (74.8 kg)   SpO2 98%   BMI 29.23 kg/m   Visual Acuity Right Eye Distance:   Left Eye Distance:   Bilateral Distance:    Right Eye Near:   Left Eye Near:    Bilateral Near:     Physical Exam Physical Exam Vitals signs and nursing note reviewed.  Constitutional:      General: She is not in acute distress.    Appearance: Normal appearance. She is not ill-appearing, toxic-appearing or diaphoretic.  HENT:     Head: Normocephalic.     Right Ear: Tympanic membrane, ear canal and external ear normal.     Left Ear: Tympanic membrane, ear canal and external ear normal.     Nose: Nose normal. Has tender maxillary and ethmoid sinuses.     Mouth/Throat: clear    Mouth: Mucous membranes are moist.  Eyes:     General: No scleral icterus.       Right eye: No discharge.        Left eye: No  discharge.     Conjunctiva/sclera: Conjunctivae normal.  Neck:     Musculoskeletal: Neck supple. No neck rigidity.  Cardiovascular:     Rate and Rhythm: Normal rate and regular rhythm.     Heart sounds: No murmur.  Pulmonary:     Effort: Pulmonary effort is normal.     Breath sounds: Normal breath sounds.   Musculoskeletal: Normal range of motion.  Lymphadenopathy:     Cervical: No cervical adenopathy.  Skin:    General: Skin is warm and dry.     Coloration: Skin is not jaundiced.     Findings: No rash.  Neurological:     Mental Status: She is  alert and oriented to person, place, and time.     Gait: Gait normal.  Psychiatric:        Mood and Affect: Mood normal.        Behavior: Behavior normal.        Thought Content: Thought content normal.        Judgment: Judgment normal.     UC Treatments / Results  Labs (all labs ordered are listed, but only abnormal results are displayed) Labs Reviewed  NOVEL CORONAVIRUS, NAA    EKG   Radiology No results found.  Procedures Procedures (including critical care time)  Medications Ordered in UC Medications - No data to display  Initial Impression / Assessment and Plan / UC Course  I have reviewed the triage vital signs and the nursing notes. I explained to her she seems to have sinus inflammation causing the pressure and advised her to try saline nose rinses. Since she has been getting worse, I went ahead and placed her on Augmentin and prednisone as noted.  Final Clinical Impressions(s) / UC Diagnoses   Final diagnoses:  Encounter for laboratory testing for COVID-19 virus  Acute non-recurrent maxillary sinusitis     Discharge Instructions     Watch this video who to do saline nose rinses.  [email or web address]   Do not use tap water, only boiled water that has been cooled down.  Do it twice a day  for 5-7 days, but avoid bed time.       ED Prescriptions    Medication Sig Dispense Auth. Provider   predniSONE (DELTASONE) 20 MG tablet Take 1 tablet (20 mg total) by mouth daily with breakfast. 5 tablet Rodriguez-Southworth, Rondle Lohse, PA-C   amoxicillin-clavulanate (AUGMENTIN) 875-125 MG tablet Take 1 tablet by mouth 2 (two) times daily. 14 tablet Rodriguez-Southworth, Sunday Spillers, PA-C     PDMP not reviewed this encounter.   Shelby Mattocks, PA-C 09/24/20 1521

## 2020-09-24 NOTE — ED Triage Notes (Signed)
Pt c/o productive cough with yellow/green sputum, HA, congestion, runny nose for approx 2 weeks. Nausea for the past few days.  Denies fever, sore throat, v/d, SOB. Has been taking tylenol-took 1000mg  approx 30 min PTA. Alka seltzer cold at night with some improvements. Pt reports at home COVID test Thursday was negative.

## 2020-09-24 NOTE — Discharge Instructions (Signed)
Watch this video who to do saline nose rinses. ° °[email or web address].bing.com/videos/[email or web address]+neti+pot+video&&view=[email or web address] ° ° °Do not use tap water, only boiled water that has been cooled down.  °Do it twice a day  for 5-7 days, but avoid bed time.  ° ° °

## 2020-09-25 LAB — SARS-COV-2, NAA 2 DAY TAT

## 2020-09-25 LAB — NOVEL CORONAVIRUS, NAA: SARS-CoV-2, NAA: NOT DETECTED

## 2020-10-14 ENCOUNTER — Other Ambulatory Visit: Payer: Self-pay | Admitting: Internal Medicine

## 2020-10-14 ENCOUNTER — Telehealth: Payer: Self-pay | Admitting: Internal Medicine

## 2020-10-14 DIAGNOSIS — N6320 Unspecified lump in the left breast, unspecified quadrant: Secondary | ICD-10-CM

## 2020-10-14 DIAGNOSIS — R928 Other abnormal and inconclusive findings on diagnostic imaging of breast: Secondary | ICD-10-CM

## 2020-10-14 NOTE — Telephone Encounter (Signed)
lft pt vm to call ofc to sch Diag mammo.

## 2020-10-17 NOTE — Telephone Encounter (Signed)
Pt has mammogram scheduled on 11/15/20 for left breast but wants to also get right breast examined as well. PCP is aware of issues with right breast. Please call pt back to let her know.

## 2020-10-17 NOTE — Telephone Encounter (Signed)
Please advise  okay to add order for right breast ?

## 2020-10-18 NOTE — Addendum Note (Signed)
Addended by: Orland Mustard on: 10/18/2020 07:11 PM   Modules accepted: Orders

## 2020-10-18 NOTE — Telephone Encounter (Signed)
I had also ordered right breast US 08/09/20 after physical  pls sch as well appt 11/15/20 1:20 with [place] right breast/axillary   If pt continues to have breast pain consider referral to breast surgery

## 2020-10-21 NOTE — Telephone Encounter (Signed)
Patient informed and verbalized understanding

## 2020-10-25 NOTE — Telephone Encounter (Signed)
Noted  

## 2020-10-25 NOTE — Telephone Encounter (Signed)
The right breast has been added to pt appt. Pt has been notified.

## 2020-11-15 ENCOUNTER — Other Ambulatory Visit: Payer: Self-pay

## 2020-11-15 ENCOUNTER — Ambulatory Visit
Admission: RE | Admit: 2020-11-15 | Discharge: 2020-11-15 | Disposition: A | Discharge status: Home / Self Care | Payer: BC Managed Care – PPO | Source: Ambulatory Visit | Attending: Internal Medicine | Admitting: Internal Medicine

## 2020-11-15 DIAGNOSIS — R928 Other abnormal and inconclusive findings on diagnostic imaging of breast: Secondary | ICD-10-CM | POA: Diagnosis present

## 2020-11-15 DIAGNOSIS — N63 Unspecified lump in unspecified breast: Secondary | ICD-10-CM

## 2020-11-15 DIAGNOSIS — R2231 Localized swelling, mass and lump, right upper limb: Secondary | ICD-10-CM | POA: Insufficient documentation

## 2020-11-15 DIAGNOSIS — N644 Mastodynia: Secondary | ICD-10-CM | POA: Diagnosis present

## 2020-11-15 DIAGNOSIS — N6320 Unspecified lump in the left breast, unspecified quadrant: Secondary | ICD-10-CM | POA: Insufficient documentation

## 2020-12-09 ENCOUNTER — Other Ambulatory Visit: Payer: Self-pay | Admitting: Internal Medicine

## 2020-12-10 MED ORDER — FLUTICASONE PROPIONATE 50 MCG/ACT NA SUSP: 2.0000 | Freq: Every day | NASAL | 11 refills | Status: DC | PRN

## 2020-12-10 NOTE — Addendum Note (Signed)
Addended by: Orland Mustard on: 12/10/2020 09:56 AM   Modules accepted: Orders

## 2021-01-21 ENCOUNTER — Telehealth: Payer: Self-pay | Admitting: Internal Medicine

## 2021-01-21 NOTE — Telephone Encounter (Signed)
Prior authorization has been submitted for patient's Valtrex   Awaiting approval or denial.

## 2021-03-31 ENCOUNTER — Other Ambulatory Visit: Payer: Self-pay | Admitting: Internal Medicine

## 2021-03-31 DIAGNOSIS — B009 Herpesviral infection, unspecified: Secondary | ICD-10-CM

## 2021-06-11 ENCOUNTER — Ambulatory Visit
Admission: EM | Admit: 2021-06-11 | Discharge: 2021-06-11 | Disposition: A | Discharge status: Home / Self Care | Payer: BC Managed Care – PPO | Attending: Emergency Medicine | Admitting: Emergency Medicine

## 2021-06-11 ENCOUNTER — Other Ambulatory Visit: Payer: Self-pay

## 2021-06-11 ENCOUNTER — Encounter: Payer: Self-pay | Admitting: Emergency Medicine

## 2021-06-11 DIAGNOSIS — B029 Zoster without complications: Secondary | ICD-10-CM

## 2021-06-11 DIAGNOSIS — Z20822 Contact with and (suspected) exposure to covid-19: Secondary | ICD-10-CM | POA: Diagnosis not present

## 2021-06-11 DIAGNOSIS — J0140 Acute pansinusitis, unspecified: Secondary | ICD-10-CM | POA: Diagnosis not present

## 2021-06-11 MED ORDER — AMOXICILLIN-POT CLAVULANATE 875-125 MG PO TABS: 1.0000 | ORAL_TABLET | Freq: Two times a day (BID) | ORAL | 0 refills | Status: DC

## 2021-06-11 MED ORDER — LIDOCAINE 5 % EX PTCH: 1.0000 | MEDICATED_PATCH | CUTANEOUS | 0 refills | Status: AC

## 2021-06-11 NOTE — ED Provider Notes (Signed)
HPI  SUBJECTIVE:  Courtney Ward is a 55 y.o. female who presents with 2 issues.  First she reports 2 days of "sinus infection" with headaches, nasal congestion, no rhinorrhea, bilateral ear pain, left maxillary frontal sinus pain and pressure, left-sided facial swelling sore throat and occasional cough.  He reports the loss of his sense of smell and taste.  No fevers, change in hearing, otorrhea, upper dental pain, postnasal drip, wheezing or shortness of breath, allergy symptoms.  No body aches, nausea, vomiting, diarrhea, abdominal pain.  He states that he is rapidly getting worse.  No known exposure to COVID or flu. She has had 5 COVID immunizations and got this years flu shot.  No antibiotics in the past month.  He took Tylenol 4 hours ago.  He has tried Human resources officer, Flonase, Mucinex D, Tylenol, saline nasal irrigation.  Flonase, Mucinex D, Tylenol and saline nasal irrigation help.  Symptoms are worse with bending forward and lying down.  Second, she reports a itchy, burning rash on her left upper chest present for the past 2 weeks.  She states that it gets inflamed and then goes down.  No preceding paresthesias, blisters, crusting.  She has been applying cortisone cream to it with improvement in her symptoms.  She has a past medical history of year-round allergies, recurrent sinusitis, seborrheic dermatitis, autoimmune arthritis and is on Plaquenil.  No history of diabetes, hypertension, chronic kidney disease.  [id number], Nino Glow, MD   Past Medical History:  Diagnosis Date   Allergy    Arthritis    Chicken pox    Chronic constipation    Colon polyps    Complication of anesthesia    per pt report she is "difficult to wake up"   Connective tissue disease ([place])    CTS (carpal tunnel syndrome)    right   CTS (carpal tunnel syndrome)    right    Endometriosis    Fibrocystic breast changes    GERD (gastroesophageal reflux disease)    Heart murmur    MVP   HSV infection     Migraines    Multinodular goiter    MVP (mitral valve prolapse)    Thyroid disease    Tubal ectopic pregnancy    Uterine fibroid    UTI (urinary tract infection)     Past Surgical History:  Procedure Laterality Date   ABLATION ON ENDOMETRIOSIS  2019   APPENDECTOMY     2005-2010 dx lap with appendectomy lysis of adhesions removal right twisted paraovarian cyst    CESAREAN SECTION     1982   COLONOSCOPY WITH PROPOFOL N/A 01/27/2019   Procedure: COLONOSCOPY WITH BIOPSY;  Surgeon: [name], MD;  Location: [place];  Service: Endoscopy;  Laterality: N/A;   ECTOPIC PREGNANCY SURGERY     2002 surgery to repair    laparascopy     08/2017 colon attached to bladder 08/2017 h/o endometriosis    LAPAROSCOPIC OOPHERECTOMY     left 1996   POLYPECTOMY N/A 01/27/2019   Procedure: POLYPECTOMY;  Surgeon: [name], MD;  Location: [place];  Service: Endoscopy;  Laterality: N/A;   THYROIDECTOMY     2007   TOE SURGERY     TONSILLECTOMY     1986   TUBAL LIGATION     2001   UTERINE FIBROID SURGERY     2004    Family History  Problem Relation Age of Onset   Arthritis Mother    Depression Mother  Diabetes Mother    Heart disease Mother    Hyperlipidemia Mother    Hypertension Mother    Kidney disease Mother    Stroke Mother    57 / [place] Mother    Arthritis Father    Diabetes Father    Heart disease Father    Hyperlipidemia Father    Hypertension Father    Arthritis Sister    Asthma Sister    Depression Sister    Drug abuse Sister    Hyperlipidemia Sister    Hypertension Sister    Learning disabilities Sister    Mental illness Sister    Alcohol abuse Brother    Arthritis Brother    COPD Brother    Depression Brother    Drug abuse Brother    Mental illness Brother    Arthritis Daughter    Depression Daughter    Hypertension Daughter    Asthma Son    Arthritis Maternal Grandmother    Asthma Maternal Grandmother    Cancer Maternal  Grandmother        bone   Depression Maternal Grandmother    Drug abuse Maternal Grandmother    Heart disease Maternal Grandmother    Hyperlipidemia Maternal Grandmother    Hypertension Maternal Grandmother    Miscarriages / Stillbirths Maternal Grandmother    Alcohol abuse Maternal Grandfather    Arthritis Maternal Grandfather    Cancer Maternal Grandfather        prostate   COPD Maternal Grandfather    COPD Paternal Grandmother    Hyperlipidemia Paternal Grandmother    Hypertension Paternal Grandmother    Miscarriages / Stillbirths Paternal Grandmother    Alcohol abuse Brother    Arthritis Brother    Depression Brother    Diabetes Brother    Drug abuse Brother    Hyperlipidemia Brother    Hypertension Brother    Learning disabilities Brother    Mental illness Brother    Alcohol abuse Brother    Arthritis Brother    COPD Brother    Depression Brother    Diabetes Brother    Drug abuse Brother    Hyperlipidemia Brother    Hypertension Brother    Breast cancer Cousin 50   Breast cancer Cousin 67    Social History   Tobacco Use   Smoking status: Former   Smokeless tobacco: Never   Tobacco comments:    from age 88 to 33 3-4 cig maternal side lung cancer   Vaping Use   Vaping Use: Never used  Substance Use Topics   Alcohol use: Not Currently   Drug use: Not Currently    No current facility-administered medications for this encounter.  Current Outpatient Medications:    amoxicillin-clavulanate (AUGMENTIN) 875-125 MG tablet, Take 1 tablet by mouth 2 (two) times daily. X 7 days, Disp: 14 tablet, Rfl: 0   Calcium Carbonate (CALCIUM-CARB 600 PO), Take 2 tablets by mouth daily., Disp: , Rfl:    Cholecalciferol (VITAMIN D3 GUMMIES PO), Take 5,000 Int'l Units by mouth., Disp: , Rfl:    cyanocobalamin (,VITAMIN B-12,) 1000 MCG/ML injection, Inject into the muscle., Disp: , Rfl:    famotidine (PEPCID) 40 MG tablet, Take 1 tablet (40 mg total) by mouth 2 (two) times daily.,  Disp: 180 tablet, Rfl: 3   fluticasone (FLONASE) 50 MCG/ACT nasal spray, Place 2 sprays into both nostrils daily as needed for allergies or rhinitis., Disp: 16 g, Rfl: 11   hydroxychloroquine (PLAQUENIL) 200 MG tablet, Take 200 mg by  mouth 2 (two) times daily. , Disp: , Rfl:    levothyroxine (SYNTHROID) 88 MCG tablet, Take 1 tablet (88 mcg total) by mouth daily before breakfast., Disp: 90 tablet, Rfl: 3   lidocaine (LIDODERM) 5 %, Place 1 patch onto the skin daily. Remove & Discard patch within 12 hours or as directed by MD, Disp: 15 patch, Rfl: 0   liothyronine (CYTOMEL) 25 MCG tablet, 1/2 daily., Disp: , Rfl:    norethindrone-ethinyl estradiol (LOESTRIN) 1-20 MG-MCG tablet, Take 1 tablet by mouth daily., Disp: , Rfl:    valACYclovir (VALTREX) 500 MG tablet, TAKE 1 TABLET BY MOUTH DAILY AS NEEDED, TWICE DAILY FOR 3 TO 7 DAYS WITH OUTBREAK, Disp: 180 tablet, Rfl: 3  Allergies  Allergen Reactions   Cortisone    Sulfa Antibiotics Hives    Rash     Tramadol Other (See Comments)    Dizzy and confused. Stays in system for days incapacitated     Codeine Rash    Nausea, cant move      ROS  As noted in HPI.   Physical Exam  BP 116/75 (BP Location: Left Arm)   Pulse 63   Temp 99.1 F (37.3 C) (Oral)   SpO2 98%   Constitutional: Well developed, well nourished, no acute distress Eyes:  EOMI, conjunctiva normal bilaterally HENT: Normocephalic, atraumatic,mucus membranes moist.  Normal turbinates.  Positive exquisite left frontal and maxillary sinus tenderness.  No appreciable facial swelling.  Purulent congestion.  No postnasal drip.  TMs normal bilaterally. Respiratory: Normal inspiratory effort Cardiovascular: Normal rate GI: nondistended skin: Flesh-colored nontender, papular grouped rash consistent with healing shingles over left anterior chest. no crusting, erythema.  Skin intact. Musculoskeletal: no deformities Neurologic: Alert & oriented x 3, no focal neuro  deficits Psychiatric: Speech and behavior appropriate   ED Course   Medications - No data to display  Orders Placed This Encounter  Procedures   Novel Coronavirus, NAA ([facility])    Standing Status:   Standing    Number of Occurrences:   1   SARS-COV-2, NAA 2 DAY TAT    Standing Status:   Standing    Number of Occurrences:   1   Results for orders placed or performed during the hospital encounter of 06/11/21  Novel Coronavirus, NAA ([facility])   Specimen: Nasopharyngeal Swab; Nasopharyngeal(NP) swabs in vial transport medium   Nasopharynge  Result Value Ref Range   SARS-CoV-2, NAA Not Detected Not Detected  SARS-COV-2, NAA 2 DAY TAT   Nasopharynge  Result Value Ref Range   SARS-CoV-2, NAA 2 DAY TAT Performed     No results found for this or any previous visit (from the past 24 hour(s)). No results found.  ED Clinical Impression  1. Acute pansinusitis, recurrence not specified   2. Encounter for laboratory testing for COVID-19 virus   3. Herpes zoster without complication      ED Assessment/Plan  1.  Left pansinusitis.  Sending off [facility].  Patient will be a candidate for antivirals if COVID is positive due to the immunocompromise.  She has a recurrent sinusitis.  We will have her continue Flonase, Mucinex D, Tylenol,saline nasal irrigation.  We will send home with Augmentin.  Advised follow-up with ENT.  COVID negative.   2.  Rash.  Appears to be shingles.  Unfortunately, she is out of the window for antiviral treatment.  Sending home with Lidoderm patch.  She will need to follow-up with her PMD if this does not improve.  Discussed MDM,  treatment plan, and plan for follow-up with patient. patient agrees with plan.   Meds ordered this encounter  Medications   amoxicillin-clavulanate (AUGMENTIN) 875-125 MG tablet    Sig: Take 1 tablet by mouth 2 (two) times daily. X 7 days    Dispense:  14 tablet    Refill:  0   lidocaine (LIDODERM) 5 %    Sig: Place 1 patch onto  the skin daily. Remove & Discard patch within 12 hours or as directed by MD    Dispense:  15 patch    Refill:  0      *This clinic note was created using [employer]. Therefore, there may be occasional mistakes despite careful proofreading.  ?;   Melynda Ripple, MD 06/14/21 [phone]

## 2021-06-11 NOTE — Discharge Instructions (Addendum)
continue Flonase, Mucinex D, Tylenol 1000 mg 3-4 times a day as needed, and saline nasal irrigation as often as you want.  Stop the Allegra the if you are taking Mucinex D.  Finish the Augmentin, even if you feel better.  Follow-up with your doctor if the rash does not get any better.  You can try the Lidoderm patch for pain.

## 2021-06-11 NOTE — ED Triage Notes (Signed)
Pt c/o sinus pressure/pain, chest congestion, sneezing and cough x 2 days. Pt also has a rash on her chest x 2 weeks

## 2021-06-12 LAB — SARS-COV-2, NAA 2 DAY TAT

## 2021-06-12 LAB — NOVEL CORONAVIRUS, NAA: SARS-CoV-2, NAA: NOT DETECTED

## 2021-06-28 ENCOUNTER — Other Ambulatory Visit: Payer: Self-pay | Admitting: Internal Medicine

## 2021-06-28 DIAGNOSIS — K219 Gastro-esophageal reflux disease without esophagitis: Secondary | ICD-10-CM

## 2021-07-04 LAB — HM PAP SMEAR: HM Pap smear: NORMAL

## 2021-08-13 ENCOUNTER — Encounter: Payer: BC Managed Care – PPO | Admitting: Internal Medicine

## 2021-09-09 ENCOUNTER — Encounter: Payer: Self-pay | Admitting: Emergency Medicine

## 2021-09-09 ENCOUNTER — Ambulatory Visit
Admission: EM | Admit: 2021-09-09 | Discharge: 2021-09-09 | Disposition: A | Discharge status: Home / Self Care | Payer: BC Managed Care – PPO | Attending: Emergency Medicine | Admitting: Emergency Medicine

## 2021-09-09 DIAGNOSIS — J0140 Acute pansinusitis, unspecified: Secondary | ICD-10-CM

## 2021-09-09 MED ORDER — IBUPROFEN 600 MG PO TABS: 600.0000 mg | ORAL_TABLET | Freq: Four times a day (QID) | ORAL | 0 refills | Status: AC | PRN

## 2021-09-09 MED ORDER — AMOXICILLIN-POT CLAVULANATE 875-125 MG PO TABS: 1.0000 | ORAL_TABLET | Freq: Two times a day (BID) | ORAL | 0 refills | Status: DC

## 2021-09-09 NOTE — Discharge Instructions (Addendum)
continue saline nasal irrigation, Flonase, ibuprofen/Tylenol together 3-4 times a day as needed for body aches, finish the Augmentin, even if you feel better.  Discontinue Allegra, start Mucinex or Mucinex D.

## 2021-09-09 NOTE — ED Triage Notes (Signed)
Pt presents with cough, sinus pressure/pain, bilateral eye drainage x 1 week

## 2021-09-09 NOTE — ED Provider Notes (Signed)
HPI  SUBJECTIVE:  Courtney Ward is a 56 y.o. female who presents with 3 weeks of sinus pain and pressure, cough productive of yellow sputum, yellow, thick rhinorrhea, nasal congestion, postnasal drip, facial swelling along her left cheek and body aches since having COVID over Christmas.  States symptoms got acutely worse 5 days ago.  No fevers, wheezing or shortness of breath.  She is not coughing all night long.  No antibiotics in the past month.  No antipyretic in the past 6 hours.  She has been doing saline nasal irrigation, Flonase and Allegra.  Saline nasal irrigation and Flonase help.  No aggravating factors.  She has a past medical history of COVID Christmas 22, mixed connective tissue disease.  No history of diabetes, hypertension, chronic kidney disease.  PMD: Gaffer   Past Medical History:  Diagnosis Date   Allergy    Arthritis    Chicken pox    Chronic constipation    Colon polyps    Complication of anesthesia    per pt report she is "difficult to wake up"   Connective tissue disease ([employer])    CTS (carpal tunnel syndrome)    right   CTS (carpal tunnel syndrome)    right    Endometriosis    Fibrocystic breast changes    GERD (gastroesophageal reflux disease)    Heart murmur    MVP   HSV infection    Migraines    Multinodular goiter    MVP (mitral valve prolapse)    Thyroid disease    Tubal ectopic pregnancy    Uterine fibroid    UTI (urinary tract infection)     Past Surgical History:  Procedure Laterality Date   ABLATION ON ENDOMETRIOSIS  2019   APPENDECTOMY     2005-2010 dx lap with appendectomy lysis of adhesions removal right twisted paraovarian cyst    CESAREAN SECTION     1982   COLONOSCOPY WITH PROPOFOL N/A 01/27/2019   Procedure: COLONOSCOPY WITH BIOPSY;  Surgeon: [name], MD;  Location: [place];  Service: Endoscopy;  Laterality: N/A;   ECTOPIC PREGNANCY SURGERY     2002 surgery to repair    laparascopy     08/2017  colon attached to bladder 08/2017 h/o endometriosis    LAPAROSCOPIC OOPHERECTOMY     left 1996   POLYPECTOMY N/A 01/27/2019   Procedure: POLYPECTOMY;  Surgeon: [name], MD;  Location: [place];  Service: Endoscopy;  Laterality: N/A;   THYROIDECTOMY     2007   TOE SURGERY     TONSILLECTOMY     1986   TUBAL LIGATION     2001   UTERINE FIBROID SURGERY     2004    Family History  Problem Relation Age of Onset   Arthritis Mother    Depression Mother    Diabetes Mother    Heart disease Mother    Hyperlipidemia Mother    Hypertension Mother    Kidney disease Mother    Stroke Mother    Miscarriages / [place] Mother    Arthritis Father    Diabetes Father    Heart disease Father    Hyperlipidemia Father    Hypertension Father    Arthritis Sister    Asthma Sister    Depression Sister    Drug abuse Sister    Hyperlipidemia Sister    Hypertension Sister    Learning disabilities Sister    Mental illness Sister    Alcohol abuse Brother  Arthritis Brother    COPD Brother    Depression Brother    Drug abuse Brother    Mental illness Brother    Arthritis Daughter    Depression Daughter    Hypertension Daughter    Asthma Son    Arthritis Maternal Grandmother    Asthma Maternal Grandmother    Cancer Maternal Grandmother        bone   Depression Maternal Grandmother    Drug abuse Maternal Grandmother    Heart disease Maternal Grandmother    Hyperlipidemia Maternal Grandmother    Hypertension Maternal Grandmother    Miscarriages / Stillbirths Maternal Grandmother    Alcohol abuse Maternal Grandfather    Arthritis Maternal Grandfather    Cancer Maternal Grandfather        prostate   COPD Maternal Grandfather    COPD Paternal Grandmother    Hyperlipidemia Paternal Grandmother    Hypertension Paternal Grandmother    Miscarriages / Stillbirths Paternal Grandmother    Alcohol abuse Brother    Arthritis Brother    Depression Brother    Diabetes Brother     Drug abuse Brother    Hyperlipidemia Brother    Hypertension Brother    Learning disabilities Brother    Mental illness Brother    Alcohol abuse Brother    Arthritis Brother    COPD Brother    Depression Brother    Diabetes Brother    Drug abuse Brother    Hyperlipidemia Brother    Hypertension Brother    Breast cancer Cousin 42   Breast cancer Cousin 28    Social History   Tobacco Use   Smoking status: Former   Smokeless tobacco: Never   Tobacco comments:    from age 1 to 79 3-4 cig maternal side lung cancer   Vaping Use   Vaping Use: Never used  Substance Use Topics   Alcohol use: Not Currently   Drug use: Not Currently    No current facility-administered medications for this encounter.  Current Outpatient Medications:    amoxicillin-clavulanate (AUGMENTIN) 875-125 MG tablet, Take 1 tablet by mouth 2 (two) times daily. X 7 days, Disp: 14 tablet, Rfl: 0   folic acid (FOLVITE) 1 MG tablet, 1 tablet, Disp: , Rfl:    ibuprofen (ADVIL) 600 MG tablet, Take 1 tablet (600 mg total) by mouth every 6 (six) hours as needed., Disp: 30 tablet, Rfl: 0   levothyroxine (SYNTHROID) 88 MCG tablet, Take by mouth., Disp: , Rfl:    Calcium Carbonate (CALCIUM-CARB 600 PO), Take 2 tablets by mouth daily., Disp: , Rfl:    Cholecalciferol (VITAMIN D3 GUMMIES PO), Take 5,000 Int'l Units by mouth., Disp: , Rfl:    cyanocobalamin (,VITAMIN B-12,) 1000 MCG/ML injection, Inject into the muscle., Disp: , Rfl:    famotidine (PEPCID) 40 MG tablet, 1 tablet at bedtime., Disp: , Rfl:    fluticasone (FLONASE) 50 MCG/ACT nasal spray, Place 2 sprays into both nostrils daily as needed for allergies or rhinitis., Disp: 16 g, Rfl: 11   hydroxychloroquine (PLAQUENIL) 200 MG tablet, Take 200 mg by mouth 2 (two) times daily. , Disp: , Rfl:    levothyroxine (SYNTHROID) 88 MCG tablet, Take 1 tablet (88 mcg total) by mouth daily before breakfast., Disp: 90 tablet, Rfl: 3   lidocaine (LIDODERM) 5 %, Place 1 patch  onto the skin daily. Remove & Discard patch within 12 hours or as directed by MD, Disp: 15 patch, Rfl: 0   liothyronine (CYTOMEL) 25 MCG  tablet, 1/2 daily., Disp: , Rfl:    liothyronine (CYTOMEL) 25 MCG tablet, 1 tablet on an empty stomach, Disp: , Rfl:    norethindrone-ethinyl estradiol (LOESTRIN) 1-20 MG-MCG tablet, Take 1 tablet by mouth daily., Disp: , Rfl:    valACYclovir (VALTREX) 500 MG tablet, TAKE 1 TABLET BY MOUTH DAILY AS NEEDED, TWICE DAILY FOR 3 TO 7 DAYS WITH OUTBREAK, Disp: 180 tablet, Rfl: 3   valACYclovir (VALTREX) 500 MG tablet, 1 tablet, Disp: , Rfl:   Allergies  Allergen Reactions   Cortisone    Sulfa Antibiotics Hives    Rash     Tramadol Other (See Comments)    Dizzy and confused. Stays in system for days incapacitated     Codeine Rash    Nausea, cant move      ROS  As noted in HPI.   Physical Exam  BP (!) 144/80 (BP Location: Left Arm)    Pulse 65    Temp 98.5 F (36.9 C) (Oral)    Resp 18    SpO2 96%   Constitutional: Well developed, well nourished, no acute distress Eyes: PERRL, EOMI, conjunctiva normal bilaterally HENT: Normocephalic, atraumatic,mucus membranes moist.  No nasal congestion.  Erythematous, swollen turbinates on the right.  Positive bilateral frontal, left maxillary sinus tenderness.  No obvious postnasal drip. Respiratory: Clear to auscultation bilaterally, no rales, no wheezing, no rhonchi Cardiovascular: Normal rate and rhythm, no murmurs, no gallops, no rubs GI: Nondistended skin: No rash, skin intact Musculoskeletal: No edema, no tenderness, no deformities Neurologic: Alert & oriented x 3, CN III-XII grossly intact, no motor deficits, sensation grossly intact Psychiatric: Speech and behavior appropriate   ED Course   Medications - No data to display  No orders of the defined types were placed in this encounter.  No results found for this or any previous visit (from the past 24 hour(s)). No results found.  ED Clinical  Impression  1. Acute non-recurrent pansinusitis      ED Assessment/Plan  Presentation consistent with a sinusitis.  Sending home with Augmentin due to duration of symptoms and exquisite frontal, maxillary sinus tenderness.  She performed saline nasal irrigation immediately prior to arrival.  She is continue saline nasal irrigation, Flonase, ibuprofen/Tylenol, home with a week of Augmentin.  Discontinue Allegra, start Mucinex or Mucinex D.  Work note for today.  Follow-up with PMD as needed  Discussed MDM, treatment plan, and plan for follow-up with patient . patient agrees with plan.   Meds ordered this encounter  Medications   amoxicillin-clavulanate (AUGMENTIN) 875-125 MG tablet    Sig: Take 1 tablet by mouth 2 (two) times daily. X 7 days    Dispense:  14 tablet    Refill:  0   ibuprofen (ADVIL) 600 MG tablet    Sig: Take 1 tablet (600 mg total) by mouth every 6 (six) hours as needed.    Dispense:  30 tablet    Refill:  0      *This clinic note was created using [employer]. Therefore, there may be occasional mistakes despite careful proofreading. ?    Melynda Ripple, MD 09/09/21 1126

## 2021-10-14 ENCOUNTER — Encounter: Payer: Self-pay | Admitting: Family

## 2021-10-14 ENCOUNTER — Ambulatory Visit: Payer: BC Managed Care – PPO | Admitting: Family

## 2021-10-14 ENCOUNTER — Other Ambulatory Visit: Payer: Self-pay

## 2021-10-14 ENCOUNTER — Ambulatory Visit (INDEPENDENT_AMBULATORY_CARE_PROVIDER_SITE_OTHER): Payer: BC Managed Care – PPO

## 2021-10-14 DIAGNOSIS — M542 Cervicalgia: Secondary | ICD-10-CM

## 2021-10-14 DIAGNOSIS — M25512 Pain in left shoulder: Secondary | ICD-10-CM

## 2021-10-14 DIAGNOSIS — R0789 Other chest pain: Secondary | ICD-10-CM | POA: Diagnosis not present

## 2021-10-14 MED ORDER — PREDNISONE 10 MG PO TABS
ORAL_TABLET | ORAL | 0 refills | Status: DC
Start: 2021-10-14 — End: 2021-11-04

## 2021-10-14 NOTE — Progress Notes (Signed)
Subjective:    Patient ID: Courtney Ward, female    DOB: 19-Mar-1966, 56 y.o.   MRN: [id number]  CC: Courtney Ward is a 56 y.o. female who presents today for an acute visit.    HPI: Complains of left shoulder, left arm and neck pain x 3 weeks, worsening.  Applying heat  Pain to raise left arm to front and side. Difficult to get dressed Describes as left arm heaviness and pain radiates to left thumb.  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  She tried home exercises without relief.    She feels heaviness on chest for the past several weeks. No CP today.  May last for one minute or less and may last as long as 2-3 minutes then resolves on its own  No associated dizziness, lightheadedness, epigastric burning, sob, diaphoresis, jaw pain.   GERD feels well controlled. She limits nsaids due to gerd.     Following with rheumatology for undiagnosed connective tissue disease No recent prednisone use  History of GERD, hypothyroidism, CTS Former smoker HISTORY:  Past Medical History:  Diagnosis Date   Allergy    Arthritis    Chicken pox    Chronic constipation    Colon polyps    Complication of anesthesia    per pt report she is "difficult to wake up"   Connective tissue disease ([place])    CTS (carpal tunnel syndrome)    right   CTS (carpal tunnel syndrome)    right    Endometriosis    Fibrocystic breast changes    GERD (gastroesophageal reflux disease)    Heart murmur    MVP   HSV infection    Migraines    Multinodular goiter    MVP (mitral valve prolapse)    Thyroid disease    Tubal ectopic pregnancy    Uterine fibroid    UTI (urinary tract infection)    Past Surgical History:  Procedure Laterality Date   ABLATION ON ENDOMETRIOSIS  2019   APPENDECTOMY     2005-2010 dx lap with appendectomy lysis of adhesions removal right twisted paraovarian cyst    CESAREAN SECTION     1982   COLONOSCOPY WITH PROPOFOL N/A 01/27/2019    Procedure: COLONOSCOPY WITH BIOPSY;  Surgeon: [name], MD;  Location: [facility];  Service: Endoscopy;  Laterality: N/A;   ECTOPIC PREGNANCY SURGERY     2002 surgery to repair    laparascopy     08/2017 colon attached to bladder 08/2017 h/o endometriosis    LAPAROSCOPIC OOPHERECTOMY     left 1996   POLYPECTOMY N/A 01/27/2019   Procedure: POLYPECTOMY;  Surgeon: [name], MD;  Location: [facility];  Service: Endoscopy;  Laterality: N/A;   THYROIDECTOMY     2007   TOE SURGERY     TONSILLECTOMY     1986   TUBAL LIGATION     2001   UTERINE FIBROID SURGERY     2004   Family History  Problem Relation Age of Onset   Arthritis Mother    Depression Mother    Diabetes Mother    Heart disease Mother 71   Hyperlipidemia Mother    Hypertension Mother    Kidney disease Mother    Stroke Mother    19 / [place] Mother    Arthritis Father    Diabetes Father    Heart disease Father 4   Hyperlipidemia Father    Hypertension  Father    Heart attack Father 33   Arthritis Sister    Asthma Sister    Depression Sister    Drug abuse Sister    Hyperlipidemia Sister    Hypertension Sister    Learning disabilities Sister    Mental illness Sister    Alcohol abuse Brother    Arthritis Brother    COPD Brother    Depression Brother    Drug abuse Brother    Mental illness Brother    Alcohol abuse Brother    Arthritis Brother    Depression Brother    Diabetes Brother    Drug abuse Brother    Hyperlipidemia Brother    Hypertension Brother    Learning disabilities Brother    Mental illness Brother    Alcohol abuse Brother    Arthritis Brother    COPD Brother    Depression Brother    Diabetes Brother    Drug abuse Brother    Hyperlipidemia Brother    Hypertension Brother    Arthritis Maternal Grandmother    Asthma Maternal Grandmother    Cancer Maternal Grandmother        bone   Depression Maternal Grandmother    Drug abuse Maternal Grandmother     Heart disease Maternal Grandmother    Hyperlipidemia Maternal Grandmother    Hypertension Maternal Grandmother    Miscarriages / Stillbirths Maternal Grandmother    Alcohol abuse Maternal Grandfather    Arthritis Maternal Grandfather    Cancer Maternal Grandfather        prostate   COPD Maternal Grandfather    COPD Paternal Grandmother    Hyperlipidemia Paternal Grandmother    Hypertension Paternal Grandmother    Miscarriages / Stillbirths Paternal Grandmother    Arthritis Daughter    Depression Daughter    Hypertension Daughter    Asthma Son    Breast cancer Cousin 70   Breast cancer Cousin 71    Allergies: Cortisone, Sulfa antibiotics, Tramadol, and Codeine Current Outpatient Medications on File Prior to Visit  Medication Sig Dispense Refill   amoxicillin-clavulanate (AUGMENTIN) 875-125 MG tablet Take 1 tablet by mouth 2 (two) times daily. X 7 days 14 tablet 0   Calcium Carbonate (CALCIUM-CARB 600 PO) Take 2 tablets by mouth daily.     Cholecalciferol (VITAMIN D3 GUMMIES PO) Take 5,000 Int'l Units by mouth.     cyanocobalamin (,VITAMIN B-12,) 1000 MCG/ML injection Inject into the muscle.     famotidine (PEPCID) 40 MG tablet 1 tablet at bedtime.     fluticasone (FLONASE) 50 MCG/ACT nasal spray Place 2 sprays into both nostrils daily as needed for allergies or rhinitis. 16 g 11   folic acid (FOLVITE) 1 MG tablet 1 tablet     hydroxychloroquine (PLAQUENIL) 200 MG tablet Take 200 mg by mouth 2 (two) times daily.      ibuprofen (ADVIL) 600 MG tablet Take 1 tablet (600 mg total) by mouth every 6 (six) hours as needed. 30 tablet 0   levothyroxine (SYNTHROID) 88 MCG tablet Take 1 tablet (88 mcg total) by mouth daily before breakfast. 90 tablet 3   levothyroxine (SYNTHROID) 88 MCG tablet Take by mouth.     lidocaine (LIDODERM) 5 % Place 1 patch onto the skin daily. Remove & Discard patch within 12 hours or as directed by MD 15 patch 0   liothyronine (CYTOMEL) 25 MCG tablet 1/2 daily.      liothyronine (CYTOMEL) 25 MCG tablet 1 tablet on an empty stomach  norethindrone-ethinyl estradiol (LOESTRIN) 1-20 MG-MCG tablet Take 1 tablet by mouth daily.     UNABLE TO FIND 1 tablet daily. Amberen Menopausal Relief     valACYclovir (VALTREX) 500 MG tablet TAKE 1 TABLET BY MOUTH DAILY AS NEEDED, TWICE DAILY FOR 3 TO 7 DAYS WITH OUTBREAK 180 tablet 3   valACYclovir (VALTREX) 500 MG tablet 1 tablet     No current facility-administered medications on file prior to visit.    Social History   Tobacco Use   Smoking status: Former   Smokeless tobacco: Never   Tobacco comments:    from age 20 to 56 3-4 cig maternal side lung cancer   Vaping Use   Vaping Use: Never used  Substance Use Topics   Alcohol use: Not Currently   Drug use: Not Currently    Review of Systems  Constitutional:  Negative for chills and fever.  Respiratory:  Negative for cough.   Cardiovascular:  Negative for chest pain and palpitations.  Gastrointestinal:  Negative for abdominal pain, nausea and vomiting.  Musculoskeletal:  Positive for back pain and neck pain.     Objective:    There were no vitals taken for this visit.   Physical Exam Vitals reviewed.  Constitutional:      Appearance: She is well-developed.  Eyes:     Conjunctiva/sclera: Conjunctivae normal.  Cardiovascular:     Rate and Rhythm: Normal rate and regular rhythm.     Pulses: Normal pulses.     Heart sounds: Normal heart sounds.  Pulmonary:     Effort: Pulmonary effort is normal.     Breath sounds: Normal breath sounds. No wheezing, rhonchi or rales.  Chest:     Chest wall: No tenderness.  Musculoskeletal:     Right shoulder: No swelling, effusion or bony tenderness. Normal range of motion. Normal strength.     Left shoulder: No swelling, effusion or bony tenderness. Decreased range of motion. Normal strength.     Cervical back: No tenderness or bony tenderness. No pain with movement.     Thoracic back: No edema, deformity  or bony tenderness. Normal range of motion.     Comments: Left Shoulder:   No asymmetry of shoulders when comparing right and left.No pain with palpation over glenohumeral joint lines,  joint, AC joint, or bicipital groove. No pain with internal and external rotation.  pain with resisted lateral extension .   Negative drop arm.   No pain, swelling, or ecchymosis noted over long head of biceps.   Strength and sensation normal BUE's.   Skin:    General: Skin is warm and dry.  Neurological:     Mental Status: She is alert.  Psychiatric:        Speech: Speech normal.        Behavior: Behavior normal.        Thought Content: Thought content normal.       Assessment & Plan:   Problem List Items Addressed This Visit       Other   Chest heaviness    Symptom not present today.  Comes and goes.  She has a very strong history of cardiovascular disease , early onset.  EKG today shows sinus rhythm without significant changes when compared to prior 05/27/2018.  We jointly agreed cardiology referral for overall restratification and also in the setting of the symptoms.  Advised extreme close vigilance if chest heaviness were to continue last longer in duration or present with shortness of breath, palpitations left  arm numbness to call 911.       Relevant Orders   EKG 12-Lead (Completed)   Ambulatory referral to Cardiology   Left shoulder pain    Presentation consistent with shoulder impingement or rotator cuff pathology.  Suspect degenerative changes of cervical spine as well.  Pending mri lumbar spine, left shoulder x-ray.  She does have history of connective tissue disease, following with rheumatology.  She will start prednisone trial and I also advised her to start Tylenol arthritis. Close follow up      Other Visit Diagnoses     Cervicalgia    -  Primary   Relevant Medications   predniSONE (DELTASONE) 10 MG tablet   Other Relevant Orders   MR Cervical Spine Wo Contrast   DG  Shoulder Left (Completed)         I am having Courtney J. Mandile "Dee" start on predniSONE. I am also having her maintain her hydroxychloroquine, liothyronine, Calcium Carbonate (CALCIUM-CARB 600 PO), levothyroxine, norethindrone-ethinyl estradiol, Cholecalciferol (VITAMIN D3 GUMMIES PO), cyanocobalamin, fluticasone, valACYclovir, lidocaine, folic acid, valACYclovir, liothyronine, levothyroxine, famotidine, amoxicillin-clavulanate, ibuprofen, and UNABLE TO FIND.   Meds ordered this encounter  Medications   predniSONE (DELTASONE) 10 MG tablet    Sig: Take 40 mg by mouth on day 1, then taper 10 mg daily until gone    Dispense:  10 tablet    Refill:  0    Order Specific Question:   Supervising Provider    Answer:   Crecencio Mc [[id number]]    Return precautions given.   Risks, benefits, and alternatives of the medications and treatment plan prescribed today were discussed, and patient expressed understanding.   Education regarding symptom management and diagnosis given to patient on AVS.  Continue to follow with McLean-Scocuzza, Nino Glow, MD for routine health maintenance.   Courtney Ward and I agreed with plan.   Mable Paris, FNP

## 2021-10-14 NOTE — Patient Instructions (Addendum)
Start prednisone  As discussed, let's start by scheduling Tylenol Arthritis which is a 650mg  tablet .   You may take 1-2 tablets every 8 hours ( scheduled) with maximum of 6 tablets per day.   For example , you could take two tablets in the morning ( 8am) and then two tablets again at 4pm.   Maximum daily dose of acetaminophen 4 g per day from all sources.  If you are taking another medication which includes acetaminophen (Tylenol) which may be in cough and cold preparations or pain medication such as Percocet, you will need to factor that into your total daily dose to be safe.  Please let me know if any questions  I have also ordered the MRI of your cervical spine and placed a referral to cardiology. Let us know if you dont hear back within a week in regards to an appointment being scheduled.

## 2021-10-15 ENCOUNTER — Telehealth: Payer: Self-pay | Admitting: Internal Medicine

## 2021-10-15 DIAGNOSIS — M25512 Pain in left shoulder: Secondary | ICD-10-CM | POA: Insufficient documentation

## 2021-10-15 NOTE — Assessment & Plan Note (Addendum)
Presentation consistent with shoulder impingement or rotator cuff pathology.  Suspect degenerative changes of cervical spine as well.  Pending mri lumbar spine, left shoulder x-ray.  She does have history of connective tissue disease, following with rheumatology.  She will start prednisone trial and I also advised her to start Tylenol arthritis. Close follow up

## 2021-10-15 NOTE — Telephone Encounter (Signed)
Waiting on ofc notes to complete auth with ins.

## 2021-10-15 NOTE — Assessment & Plan Note (Signed)
Symptom not present today.  Comes and goes.  She has a very strong history of cardiovascular disease , early onset.  EKG today shows sinus rhythm without significant changes when compared to prior 05/27/2018.  We jointly agreed cardiology referral for overall restratification and also in the setting of the symptoms.  Advised extreme close vigilance if chest heaviness were to continue last longer in duration or present with shortness of breath, palpitations left arm numbness to call 911.

## 2021-10-17 ENCOUNTER — Ambulatory Visit: Payer: BC Managed Care – PPO

## 2021-10-17 ENCOUNTER — Other Ambulatory Visit: Payer: Self-pay

## 2021-10-17 ENCOUNTER — Ambulatory Visit: Payer: BC Managed Care – PPO | Admitting: Cardiovascular Disease

## 2021-10-17 ENCOUNTER — Encounter: Payer: Self-pay | Admitting: Cardiovascular Disease

## 2021-10-17 VITALS — BP 110/70 | HR 61 | Ht 64.0 in | Wt 171.2 lb

## 2021-10-17 DIAGNOSIS — Z8249 Family history of ischemic heart disease and other diseases of the circulatory system: Secondary | ICD-10-CM | POA: Diagnosis not present

## 2021-10-17 DIAGNOSIS — M542 Cervicalgia: Secondary | ICD-10-CM

## 2021-10-17 DIAGNOSIS — M1990 Unspecified osteoarthritis, unspecified site: Secondary | ICD-10-CM

## 2021-10-17 DIAGNOSIS — R079 Chest pain, unspecified: Secondary | ICD-10-CM

## 2021-10-17 NOTE — Patient Instructions (Signed)
Paravertebral muscle spasm  Medication Instructions:  No changes  If you need a refill on your cardiac medications before your next appointment, please call your pharmacy.   Lab work: No new labs needed  Testing/Procedures: No new testing needed  Follow-Up: At [facility], you and your health needs are our priority.  As part of our continuing mission to provide you with exceptional heart care, we have created designated Provider Care Teams.  These Care Teams include your primary Cardiologist (physician) and Advanced Practice Providers (APPs -  Physician Assistants and Nurse Practitioners) who all work together to provide you with the care you need, when you need it.  You will need a follow up appointment as needed  Providers on your designated Care Team:   Murray Hodgkins, NP Christell Faith, PA-C Cadence Kathlen Mody, [place]  COVID-19 Vaccine Information can be found at: [email or web address].uk For questions related to vaccine distribution or appointments, please email [email or web address] or call [phone].

## 2021-10-17 NOTE — Progress Notes (Signed)
Cardiology Office Note  Date:  10/17/2021   ID:  Amal DEDRIA ENDRES, DOB 1965-12-04, MRN [id number]  PCP:  McLean-Scocuzza, Nino Glow, MD   Chief Complaint  Patient presents with   New Patient (Initial Visit)    Ref by Mable Paris, FNP for heart murmur & chest heaviness. Patient c/o dizziness, lightheaded, tingling and numbness in hands with numbness in left arm, shortness of breath and chest heaviness; symptoms x 3 weeks. Medications reviewed by the patient verbally.     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, lots of computer work Gets tingling numbness in the hands at times  Started exercise with weights around christmas 2022  Developed some neck pain: 3-4 weeks ago Left side, feels like something needs to be " popped" Muscles feel tight, tender Occasional twinges in the chest  Able to treadmill ok  Lab work reviewed Total cholesterol 158 LDL 67 A1c 5.5  CT scan chest 2019 No coronary calcification no aortic atherosclerosis Images pulled up and reviewed  EKG personally reviewed by myself on todays visit Normal sinus rhythm rate 61 bpm no significant ST wave changes  PMH:   has a past medical history of Allergy, Arthritis, Chicken pox, Chronic constipation, Colon polyps, Complication of anesthesia, Connective tissue disease ([place]), CTS (carpal tunnel syndrome), CTS (carpal tunnel syndrome), Endometriosis, Fibrocystic breast changes, GERD (gastroesophageal reflux disease), Heart murmur, HSV infection, Migraines, Multinodular goiter, MVP (mitral valve prolapse), Thyroid disease, Tubal ectopic pregnancy, Uterine fibroid, and UTI (urinary tract infection).  PSH:    Past Surgical History:  Procedure Laterality Date   ABLATION ON ENDOMETRIOSIS  2019   APPENDECTOMY     2005-2010 dx lap with  appendectomy lysis of adhesions removal right twisted paraovarian cyst    CESAREAN SECTION     1982   COLONOSCOPY WITH PROPOFOL N/A 01/27/2019   Procedure: COLONOSCOPY WITH BIOPSY;  Surgeon: [name], MD;  Location: [facility];  Service: Endoscopy;  Laterality: N/A;   ECTOPIC PREGNANCY SURGERY     2002 surgery to repair    laparascopy     08/2017 colon attached to bladder 08/2017 h/o endometriosis    LAPAROSCOPIC OOPHERECTOMY     left 1996   POLYPECTOMY N/A 01/27/2019   Procedure: POLYPECTOMY;  Surgeon: [name], MD;  Location: [facility];  Service: Endoscopy;  Laterality: N/A;   THYROIDECTOMY     2007   TOE SURGERY     TONSILLECTOMY     1986   TUBAL LIGATION     2001   UTERINE FIBROID SURGERY     2004    Current Outpatient Medications  Medication Sig Dispense Refill   Calcium Carbonate (CALCIUM-CARB 600 PO) Take 2 tablets by mouth daily.     Cholecalciferol (VITAMIN D3 GUMMIES PO) Take 5,000 Int'l Units by mouth.     cyanocobalamin (,VITAMIN B-12,) 1000 MCG/ML injection Inject into the muscle.     famotidine (PEPCID) 40 MG tablet 1 tablet at bedtime.     fluticasone (FLONASE) 50 MCG/ACT nasal spray Place 2 sprays into both nostrils daily as needed for allergies or rhinitis. 16 g 11   folic acid (FOLVITE) 1 MG tablet 1 tablet     hydroxychloroquine (PLAQUENIL) 200 MG tablet Take 200 mg by mouth 2 (two) times daily.      ibuprofen (ADVIL) 600 MG  tablet Take 1 tablet (600 mg total) by mouth every 6 (six) hours as needed. 30 tablet 0   levothyroxine (SYNTHROID) 88 MCG tablet Take 1 tablet (88 mcg total) by mouth daily before breakfast. 90 tablet 3   lidocaine (LIDODERM) 5 % Place 1 patch onto the skin daily. Remove & Discard patch within 12 hours or as directed by MD 15 patch 0   liothyronine (CYTOMEL) 25 MCG tablet 1/2 daily.     norethindrone-ethinyl estradiol (LOESTRIN) 1-20 MG-MCG tablet Take 1 tablet by mouth daily.     predniSONE (DELTASONE) 10 MG tablet  Take 40 mg by mouth on day 1, then taper 10 mg daily until gone 10 tablet 0   UNABLE TO FIND 1 tablet daily. Amberen Menopausal Relief     valACYclovir (VALTREX) 500 MG tablet TAKE 1 TABLET BY MOUTH DAILY AS NEEDED, TWICE DAILY FOR 3 TO 7 DAYS WITH OUTBREAK 180 tablet 3   amoxicillin-clavulanate (AUGMENTIN) 875-125 MG tablet Take 1 tablet by mouth 2 (two) times daily. X 7 days (Patient not taking: Reported on 10/17/2021) 14 tablet 0   levothyroxine (SYNTHROID) 88 MCG tablet Take by mouth. (Patient not taking: Reported on 10/17/2021)     liothyronine (CYTOMEL) 25 MCG tablet 1 tablet on an empty stomach (Patient not taking: Reported on 10/17/2021)     valACYclovir (VALTREX) 500 MG tablet 1 tablet (Patient not taking: Reported on 10/17/2021)     No current facility-administered medications for this visit.    Allergies:   Cortisone, Sulfa antibiotics, Sulfamethoxazole, Tramadol, Tramadol hcl, and Codeine   Social History:  The patient  reports that she has quit smoking. She has never used smokeless tobacco. She reports that she does not currently use alcohol. She reports that she does not currently use drugs.   Family History:   family history includes Alcohol abuse in her brother, brother, brother, and maternal grandfather; Arthritis in her brother, brother, brother, daughter, father, maternal grandfather, maternal grandmother, mother, and sister; Asthma in her maternal grandmother, sister, and son; Breast cancer (age of onset: 26) in her cousin and cousin; COPD in her brother, brother, maternal grandfather, and paternal grandmother; Cancer in her maternal grandfather and maternal grandmother; Depression in her brother, brother, brother, daughter, maternal grandmother, mother, and sister; Diabetes in her brother, brother, father, and mother; Drug abuse in her brother, brother, brother, maternal grandmother, and sister; Heart attack (age of onset: 52) in her father; Heart disease in her maternal grandmother;  Heart disease (age of onset: 65) in her father; Heart disease (age of onset: 62) in her mother; Hyperlipidemia in her brother, brother, father, maternal grandmother, mother, paternal grandmother, and sister; Hypertension in her brother, brother, daughter, father, maternal grandmother, mother, paternal grandmother, and sister; Kidney disease in her mother; Learning disabilities in her brother and sister; Mental illness in her brother, brother, and sister; Miscarriages / Stillbirths in her maternal grandmother, mother, and paternal grandmother; Stroke in her mother.    Review of Systems: Review of Systems  Constitutional: Negative.   HENT: Negative.    Respiratory: Negative.    Cardiovascular:  Positive for chest pain.  Gastrointestinal: Negative.   Musculoskeletal:  Positive for neck pain.  Neurological: Negative.   Psychiatric/Behavioral: Negative.    All other systems reviewed and are negative.   PHYSICAL EXAM: VS:  BP 110/70 (BP Location: Left Arm, Patient Position: Sitting, Cuff Size: Normal)    Pulse 61    Ht 5\' 4"  (1.626 m)    Wt 171 lb 4  oz (77.7 kg)    SpO2 99%    BMI 29.39 kg/m  , BMI Body mass index is 29.39 kg/m. GEN: Well nourished, well developed, in no acute distress HEENT: normal Neck: no JVD, carotid bruits, or masses Cardiac: RRR; no murmurs, rubs, or gallops,no edema  Respiratory:  clear to auscultation bilaterally, normal work of breathing GI: soft, nontender, nondistended, + BS MS: no deformity or atrophy Skin: warm and dry, no rash Neuro:  Strength and sensation are intact Psych: euthymic mood, full affect   Recent Labs: No results found for requested labs within last 8760 hours.    Lipid Panel Lab Results  Component Value Date   CHOL 161 03/09/2019   HDL 76 03/09/2019   LDLCALC 69 03/09/2019   TRIG 79 03/09/2019      Wt Readings from Last 3 Encounters:  10/17/21 171 lb 4 oz (77.7 kg)  09/24/20 165 lb (74.8 kg)  08/09/20 165 lb (74.8 kg)        ASSESSMENT AND PLAN:  Problem List Items Addressed This Visit   None Visit Diagnoses     Chest pain of uncertain etiology    -  Primary   Relevant Orders   EKG 12-Lead   Neck pain       Relevant Orders   EKG 12-Lead   Arthritis       Family history of coronary artery disease          Chest pain Atypical in nature, fleeting, not associated with exertion No symptoms on the treadmill Suspect chest wall pain/musculoskeletal No significant risk factors, cholesterol very well controlled on no statin,  CT scan chest pulled up with no coronary calcification no aortic atherosclerosis Normal EKG No smoking, no diabetes No further work-up at this time  Neck pain Consistent with paravertebral muscle spasms Etiology unclear, recommended she change her pillow, but better ergonomics at work, avoid heavy lifting for now including [employer], Try massage, heat packs If no improvement may need chiropractic, x-ray of neck  Family history coronary disease Ms Cranford has excellent cholesterol, no diabetes, non-smoker CT scan clear of atherosclerosis and coronary calcification No further testing needed The above would place her at low risk going forward   Total encounter time more than 60 minutes  Greater than 50% was spent in counseling and coordination of care with the patient  Patient seen in consultation for Mable Paris will be referred back to her office for ongoing care of the issues detailed above  Signed, Esmond Plants, M.D., Ph.D. [place], [place]

## 2021-10-18 ENCOUNTER — Ambulatory Visit
Admission: RE | Admit: 2021-10-18 | Discharge: 2021-10-18 | Disposition: A | Discharge status: Home / Self Care | Payer: BC Managed Care – PPO | Source: Ambulatory Visit | Attending: Family | Admitting: Family

## 2021-10-18 DIAGNOSIS — M542 Cervicalgia: Secondary | ICD-10-CM | POA: Diagnosis present

## 2021-10-20 ENCOUNTER — Other Ambulatory Visit: Payer: Self-pay

## 2021-10-20 DIAGNOSIS — M542 Cervicalgia: Secondary | ICD-10-CM

## 2021-10-20 DIAGNOSIS — M25512 Pain in left shoulder: Secondary | ICD-10-CM

## 2021-11-03 ENCOUNTER — Telehealth: Payer: Self-pay | Admitting: Internal Medicine

## 2021-11-03 NOTE — Telephone Encounter (Signed)
Left message, attempting to contact Patient to complete pre-charting phone call for appointment tomorrow, 11/05/21.  ?

## 2021-11-04 ENCOUNTER — Ambulatory Visit: Payer: BC Managed Care – PPO | Admitting: Internal Medicine

## 2021-11-04 ENCOUNTER — Encounter: Payer: Self-pay | Admitting: Internal Medicine

## 2021-11-04 ENCOUNTER — Other Ambulatory Visit: Payer: Self-pay

## 2021-11-04 VITALS — BP 118/70 | HR 68 | Temp 98.5°F | Ht 64.0 in | Wt 173.8 lb

## 2021-11-04 DIAGNOSIS — G8929 Other chronic pain: Secondary | ICD-10-CM

## 2021-11-04 DIAGNOSIS — M19012 Primary osteoarthritis, left shoulder: Secondary | ICD-10-CM

## 2021-11-04 DIAGNOSIS — Z13220 Encounter for screening for lipoid disorders: Secondary | ICD-10-CM

## 2021-11-04 DIAGNOSIS — Z23 Encounter for immunization: Secondary | ICD-10-CM

## 2021-11-04 DIAGNOSIS — E538 Deficiency of other specified B group vitamins: Secondary | ICD-10-CM

## 2021-11-04 DIAGNOSIS — M5030 Other cervical disc degeneration, unspecified cervical region: Secondary | ICD-10-CM

## 2021-11-04 DIAGNOSIS — M545 Low back pain, unspecified: Secondary | ICD-10-CM

## 2021-11-04 DIAGNOSIS — R7989 Other specified abnormal findings of blood chemistry: Secondary | ICD-10-CM

## 2021-11-04 DIAGNOSIS — Z1231 Encounter for screening mammogram for malignant neoplasm of breast: Secondary | ICD-10-CM

## 2021-11-04 DIAGNOSIS — M542 Cervicalgia: Secondary | ICD-10-CM

## 2021-11-04 DIAGNOSIS — M4802 Spinal stenosis, cervical region: Secondary | ICD-10-CM

## 2021-11-04 DIAGNOSIS — R937 Abnormal findings on diagnostic imaging of other parts of musculoskeletal system: Secondary | ICD-10-CM

## 2021-11-04 DIAGNOSIS — E559 Vitamin D deficiency, unspecified: Secondary | ICD-10-CM

## 2021-11-04 LAB — BASIC METABOLIC PANEL
BUN: 8 mg/dL (ref 6–23)
CO2: 31 mEq/L (ref 19–32)
Calcium: 9.5 mg/dL (ref 8.4–10.5)
Chloride: 105 mEq/L (ref 96–112)
Creatinine, Ser: 0.84 mg/dL (ref 0.40–1.20)
GFR: 76.99 mL/min (ref >60.00)
Glucose, Bld: 91 mg/dL (ref 70–99)
Potassium: 3.9 mEq/L (ref 3.5–5.1)
Sodium: 142 mEq/L (ref 135–145)

## 2021-11-04 LAB — LIPID PANEL
Cholesterol: 190 mg/dL (ref 0–200)
HDL: 84.2 mg/dL (ref >39.00)
LDL Cholesterol: 92 mg/dL (ref 0–99)
NonHDL: 105.9
Total CHOL/HDL Ratio: 2
Triglycerides: 69 mg/dL (ref 0.0–149.0)
VLDL: 13.8 mg/dL (ref 0.0–40.0)

## 2021-11-04 LAB — VITAMIN B12: Vitamin B-12: 825 pg/mL (ref 211–911)

## 2021-11-04 LAB — VITAMIN D 25 HYDROXY (VIT D DEFICIENCY, FRACTURES): VITD: 49.27 ng/mL (ref 30.00–100.00)

## 2021-11-04 MED ORDER — SHINGRIX 50 MCG/0.5ML IM SUSR: 0.5000 mL | Freq: Once | INTRAMUSCULAR | 1 refills | Status: AC

## 2021-11-04 MED ORDER — TIZANIDINE HCL 4 MG PO TABS: 4.0000 mg | ORAL_TABLET | Freq: Every evening | ORAL | 5 refills | Status: DC | PRN

## 2021-11-04 NOTE — Progress Notes (Signed)
Chief Complaint  ?Patient presents with  ? Follow-up  ? ?F/u  ?1. Left shoulder pain 4/10 and chronic neck and low back pain with abnormal MRI neck 10/16/21  ?Declines cortisone shot due to prior side effect  ?Saw [facility] and no f/u rec will do PT also disc consider chiropractor as well  ?H/o right frozen shoulder  ? ? ?Review of Systems  ?Constitutional:  Negative for weight loss.  ?HENT:  Negative for hearing loss.   ?Eyes:  Negative for blurred vision.  ?Respiratory:  Negative for shortness of breath.   ?Cardiovascular:  Negative for chest pain.  ?Gastrointestinal:  Negative for abdominal pain and blood in stool.  ?Genitourinary:  Negative for dysuria.  ?Musculoskeletal:  Positive for back pain, joint pain and neck pain. Negative for falls.  ?Skin:  Negative for rash.  ?Neurological:  Negative for headaches.  ?Psychiatric/Behavioral:  Negative for depression.   ?Past Medical History:  ?Diagnosis Date  ? Allergy   ? Arthritis   ? Chicken pox   ? Chronic constipation   ? Colon polyps   ? Complication of anesthesia   ? per pt report she is "difficult to wake up"  ? Connective tissue disease ([place])   ? CTS (carpal tunnel syndrome)   ? right  ? CTS (carpal tunnel syndrome)   ? right   ? Endometriosis   ? Fibrocystic breast changes   ? Frozen shoulder syndrome   ? right  ? GERD (gastroesophageal reflux disease)   ? Heart murmur   ? MVP  ? HSV infection   ? Migraines   ? Multinodular goiter   ? MVP (mitral valve prolapse)   ? Thyroid disease   ? Tubal ectopic pregnancy   ? Uterine fibroid   ? UTI (urinary tract infection)   ? ?Past Surgical History:  ?Procedure Laterality Date  ? ABLATION ON ENDOMETRIOSIS  2019  ? APPENDECTOMY    ? 2005-2010 dx lap with appendectomy lysis of adhesions removal right twisted paraovarian cyst   ? CESAREAN SECTION    ? 1982  ? COLONOSCOPY WITH PROPOFOL N/A 01/27/2019  ? Procedure: COLONOSCOPY WITH BIOPSY;  Surgeon: [name], MD;  Location: [facility];  Service: Endoscopy;   Laterality: N/A;  ? ECTOPIC PREGNANCY SURGERY    ? 2002 surgery to repair   ? laparascopy    ? 08/2017 colon attached to bladder 08/2017 h/o endometriosis   ? LAPAROSCOPIC OOPHERECTOMY    ? left 1996  ? POLYPECTOMY N/A 01/27/2019  ? Procedure: POLYPECTOMY;  Surgeon: [name], MD;  Location: [facility];  Service: Endoscopy;  Laterality: N/A;  ? THYROIDECTOMY    ? 2007  ? TOE SURGERY    ? TONSILLECTOMY    ? 1986  ? TUBAL LIGATION    ? 2001  ? UTERINE FIBROID SURGERY    ? 2004  ? ?Family History  ?Problem Relation Age of Onset  ? Arthritis Mother   ? Depression Mother   ? Diabetes Mother   ? Heart disease Mother 93  ? Hyperlipidemia Mother   ? Hypertension Mother   ? Kidney disease Mother   ? Stroke Mother   ? Miscarriages / [place] Mother   ? Arthritis Father   ? Diabetes Father   ? Heart disease Father 39  ? Hyperlipidemia Father   ? Hypertension Father   ? Heart attack Father 89  ? Arthritis Sister   ? Asthma Sister   ? Depression Sister   ? Drug  abuse Sister   ? Hyperlipidemia Sister   ? Hypertension Sister   ? Learning disabilities Sister   ? Mental illness Sister   ? Alcohol abuse Brother   ? Arthritis Brother   ? COPD Brother   ? Depression Brother   ? Drug abuse Brother   ? Mental illness Brother   ? Alcohol abuse Brother   ? Arthritis Brother   ? Depression Brother   ? Diabetes Brother   ? Drug abuse Brother   ? Hyperlipidemia Brother   ? Hypertension Brother   ? Learning disabilities Brother   ? Mental illness Brother   ? Alcohol abuse Brother   ? Arthritis Brother   ? COPD Brother   ? Depression Brother   ? Diabetes Brother   ? Drug abuse Brother   ? Hyperlipidemia Brother   ? Hypertension Brother   ? Arthritis Maternal Grandmother   ? Asthma Maternal Grandmother   ? Cancer Maternal Grandmother   ?     bone  ? Depression Maternal Grandmother   ? Drug abuse Maternal Grandmother   ? Heart disease Maternal Grandmother   ? Hyperlipidemia Maternal Grandmother   ? Hypertension Maternal Grandmother   ?  Miscarriages / Stillbirths Maternal Grandmother   ? Alcohol abuse Maternal Grandfather   ? Arthritis Maternal Grandfather   ? Cancer Maternal Grandfather   ?     prostate  ? COPD Maternal Grandfather   ? COPD Paternal Grandmother   ? Hyperlipidemia Paternal Grandmother   ? Hypertension Paternal Grandmother   ? Miscarriages / Stillbirths Paternal Grandmother   ? Arthritis Daughter   ? Depression Daughter   ? Hypertension Daughter   ? Asthma Son   ? Breast cancer Cousin 79  ? Breast cancer Cousin 36  ? ?Social History  ? ?Socioeconomic History  ? Marital status: Divorced  ?  Spouse name: Not on file  ? Number of children: Not on file  ? Years of education: Not on file  ? Highest education level: Not on file  ?Occupational History  ? Not on file  ?Tobacco Use  ? Smoking status: Former  ? Smokeless tobacco: Never  ? Tobacco comments:  ?  from age 71 to 34 3-4 cig maternal side lung cancer   ?Vaping Use  ? Vaping Use: Never used  ?Substance and Sexual Activity  ? Alcohol use: Not Currently  ? Drug use: Not Currently  ? Sexual activity: Yes  ?  Comment: men  ?Other Topics Concern  ? Not on file  ?Social History Narrative  ? No guns   ? Wears seat belt   ? Safe in relationship   ? Divorced 4 pregnancies 2 live births   ? 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 ?Social Determinants of Health  ? ?Financial Resource Strain: Not on file  ?Food Insecurity: Not on file  ?Transportation Needs: Not on file  ?Physical Activity: Not on file  ?Stress: Not on file  ?Social Connections: Not on file  ?Intimate Partner Violence: Not on file  ? ?Current Meds  ?Medication Sig  ? Calcium Carbonate (CALCIUM-CARB 600 PO) Take 2 tablets by mouth daily.  ? Cholecalciferol (VITAMIN D3 GUMMIES PO) Take 5,000 Int'l Units by mouth.  ? cyanocobalamin (,VITAMIN B-12,) 1000 MCG/ML injection Inject into the muscle.  ? famotidine (PEPCID) 40 MG tablet 1 tablet at bedtime.  ? fluticasone (FLONASE) 50 MCG/ACT nasal  spray Place 2 sprays into both nostrils daily as needed  for allergies or rhinitis.  ? folic acid (FOLVITE) 1 MG tablet 1 tablet  ? hydroxychloroquine (PLAQUENIL) 200 MG tablet Take 200 mg by mouth 2 (two) times daily.   ? ibuprofen (ADVIL) 600 MG tablet Take 1 tablet (600 mg total) by mouth every 6 (six) hours as needed.  ? levothyroxine (SYNTHROID) 88 MCG tablet Take 1 tablet (88 mcg total) by mouth daily before breakfast.  ? levothyroxine (SYNTHROID) 88 MCG tablet Take by mouth.  ? lidocaine (LIDODERM) 5 % Place 1 patch onto the skin daily. Remove & Discard patch within 12 hours or as directed by MD  ? liothyronine (CYTOMEL) 25 MCG tablet 1/2 daily.  ? tiZANidine (ZANAFLEX) 4 MG tablet Take 1 tablet (4 mg total) by mouth at bedtime as needed for muscle spasms.  ? UNABLE TO FIND 1 tablet daily. Amberen Menopausal Relief  ? valACYclovir (VALTREX) 500 MG tablet TAKE 1 TABLET BY MOUTH DAILY AS NEEDED, TWICE DAILY FOR 3 TO 7 DAYS WITH OUTBREAK  ? valACYclovir (VALTREX) 500 MG tablet   ? Zoster Vaccine Adjuvanted [facility]) injection Inject 0.5 mLs into the muscle once for 1 dose. X2 doses  ? ?Allergies  ?Allergen Reactions  ? Cortisone   ?  ER visit   ? Sulfa Antibiotics Hives  ?  Rash  ?  ? Sulfamethoxazole Other (See Comments)  ? Tramadol Other (See Comments)  ?  Dizzy and confused. Stays in system for days incapacitated  ?  ? Tramadol Hcl Other (See Comments)  ? Codeine Rash and Other (See Comments)  ?  Nausea, cant move   ? ?No results found for this or any previous visit (from the past 2160 hour(s)). ?Objective  ?Body mass index is 29.83 kg/m?. ?Wt Readings from Last 3 Encounters:  ?11/04/21 173 lb 12.8 oz (78.8 kg)  ?10/17/21 171 lb 4 oz (77.7 kg)  ?09/24/20 165 lb (74.8 kg)  ? ?Temp Readings from Last 3 Encounters:  ?11/04/21 98.5 ?F (36.9 ?C) (Oral)  ?09/09/21 98.5 ?F (36.9 ?C) (Oral)  ?06/11/21 99.1 ?F (37.3 ?C) (Oral)  ? ?BP Readings from Last 3 Encounters:  ?11/04/21 118/70  ?10/17/21 110/70  ?09/09/21 (!)  144/80  ? ?Pulse Readings from Last 3 Encounters:  ?11/04/21 68  ?10/17/21 61  ?09/09/21 65  ? ? ?Physical Exam ?Vitals and nursing note reviewed.  ?Constitutional:   ?   Appearance: Normal appearance. She i

## 2021-11-04 NOTE — Patient Instructions (Signed)
Zoster Vaccine, Recombinant injection ?What is this medication? ?ZOSTER VACCINE (ZOS ter vak SEEN) is a vaccine used to reduce the risk of getting shingles. This vaccine is not used to treat shingles or nerve pain from shingles. ?This medicine may be used for other purposes; ask your health care provider or pharmacist if you have questions. ?COMMON BRAND NAME(S): SHINGRIX ?What should I tell my care team before I take this medication? ?They need to know if you have any of these conditions: ?cancer ?immune system problems ?an unusual or allergic reaction to Zoster vaccine, other medications, foods, dyes, or preservatives ?pregnant or trying to get pregnant ?breast-feeding ?How should I use this medication? ?This vaccine is injected into a muscle. It is given by a health care provider. ?A copy of Vaccine Information Statements will be given before each vaccination. Be sure to read this information carefully each time. This sheet may change often. ?Talk to your health care provider about the use of this vaccine in children. This vaccine is not approved for use in children. ?Overdosage: If you think you have taken too much of this medicine contact a poison control center or emergency room at once. ?NOTE: This medicine is only for you. Do not share this medicine with others. ?What if I miss a dose? ?Keep appointments for follow-up (booster) doses. It is important not to miss your dose. Call your health care provider if you are unable to keep an appointment. ?What may interact with this medication? ?medicines that suppress your immune system ?medicines to treat cancer ?steroid medicines like prednisone or cortisone ?This list may not describe all possible interactions. Give your health care provider a list of all the medicines, herbs, non-prescription drugs, or dietary supplements you use. Also tell them if you smoke, drink alcohol, or use illegal drugs. Some items may interact with your medicine. ?What should I watch for  while using this medication? ?Visit your health care provider regularly. ?This vaccine, like all vaccines, may not fully protect everyone. ?What side effects may I notice from receiving this medication? ?Side effects that you should report to your doctor or health care professional as soon as possible: ?allergic reactions (skin rash, itching or hives; swelling of the face, lips, or tongue) ?trouble breathing ?Side effects that usually do not require medical attention (report these to your doctor or health care professional if they continue or are bothersome): ?chills ?headache ?fever ?nausea ?pain, redness, or irritation at site where injected ?tiredness ?vomiting ?This list may not describe all possible side effects. Call your doctor for medical advice about side effects. You may report side effects to FDA at 1-800-FDA-1088. ?Where should I keep my medication? ?This vaccine is only given by a health care provider. It will not be stored at home. ?NOTE: This sheet is a summary. It may not cover all possible information. If you have questions about this medicine, talk to your doctor, pharmacist, or health care provider. ?? 2022 Elsevier/Gold Standard (2021-04-29 00:00:00) ? ?

## 2021-11-05 LAB — MICROALBUMIN / CREATININE URINE RATIO
Creatinine, Urine: 126 mg/dL (ref 20–275)
Microalb, Ur: <0.2 mg/dL

## 2021-11-05 LAB — SODIUM, URINE, RANDOM: Sodium, Ur: 113 mmol/L (ref 28–272)

## 2021-11-05 LAB — URINALYSIS, ROUTINE W REFLEX MICROSCOPIC
Bilirubin Urine: NEGATIVE
Glucose, UA: NEGATIVE
Hgb urine dipstick: NEGATIVE
Ketones, ur: NEGATIVE
Leukocytes,Ua: NEGATIVE
Nitrite: NEGATIVE
Protein, ur: NEGATIVE
Specific Gravity, Urine: 1.016 (ref 1.001–1.035)
pH: 7.5 (ref 5.0–8.0)

## 2021-12-29 LAB — HM DIABETES EYE EXAM

## 2021-12-31 ENCOUNTER — Encounter: Payer: Self-pay | Admitting: Internal Medicine

## 2022-01-01 ENCOUNTER — Other Ambulatory Visit: Payer: Self-pay | Admitting: Internal Medicine

## 2022-01-05 LAB — HM DIABETES EYE EXAM

## 2022-01-06 ENCOUNTER — Ambulatory Visit
Admission: RE | Admit: 2022-01-06 | Discharge: 2022-01-06 | Disposition: A | Discharge status: Home / Self Care | Payer: BC Managed Care – PPO | Source: Ambulatory Visit | Attending: Internal Medicine | Admitting: Internal Medicine

## 2022-01-06 DIAGNOSIS — Z1231 Encounter for screening mammogram for malignant neoplasm of breast: Secondary | ICD-10-CM | POA: Diagnosis present

## 2022-03-20 ENCOUNTER — Telehealth: Payer: Self-pay | Admitting: Internal Medicine

## 2022-03-20 NOTE — Telephone Encounter (Signed)
Pt called in requesting refill on medication (valACYclovir (VALTREX) 500 MG tablet)... Pt stated that the pharmacy advised her that the insurance wouldn't approve it because of the way the direction is advising the pt to take the medication... Pharmacy/Insurance is requesting new script.... Pt was wondering if Dr. Olivia Mackie can write the script for take once daily... Pt requesting callback

## 2022-03-24 ENCOUNTER — Other Ambulatory Visit: Payer: Self-pay | Admitting: Internal Medicine

## 2022-03-24 DIAGNOSIS — B009 Herpesviral infection, unspecified: Secondary | ICD-10-CM

## 2022-03-24 MED ORDER — VALACYCLOVIR HCL 500 MG PO TABS: 500.0000 mg | ORAL_TABLET | Freq: Every day | ORAL | 3 refills | Status: DC

## 2022-03-24 NOTE — Telephone Encounter (Signed)
Spoke with pt and will be calling the pharmacy to get more info

## 2022-03-24 NOTE — Telephone Encounter (Signed)
Take valtrex 500 mg daily and bid x 3-5 days with outbreak

## 2022-03-30 NOTE — Telephone Encounter (Signed)
Ok please help Courtney Ward with the PA process to get more pills approved Valtrex   Thank you

## 2022-03-31 ENCOUNTER — Telehealth: Payer: Self-pay

## 2022-03-31 NOTE — Telephone Encounter (Signed)
Called her pharmacy to update that it has been approved and it pharm tech said they will fill it and it will be ready by tomorrow by 11 am

## 2022-03-31 NOTE — Telephone Encounter (Signed)
If you need assistance with PA let me know.

## 2022-03-31 NOTE — Telephone Encounter (Signed)
Spoke with pharmacy in regards to the valACYclovir had to start a PA. [facility] to start the PA it was approved.  Case ID: [id number] Approval dates 03-01-2022 thru 03-31-2023 QTY 120

## 2022-04-17 ENCOUNTER — Other Ambulatory Visit: Payer: Self-pay | Admitting: Internal Medicine

## 2022-04-17 DIAGNOSIS — B009 Herpesviral infection, unspecified: Secondary | ICD-10-CM

## 2022-04-20 ENCOUNTER — Other Ambulatory Visit: Payer: Self-pay | Admitting: Internal Medicine

## 2022-04-20 DIAGNOSIS — B009 Herpesviral infection, unspecified: Secondary | ICD-10-CM

## 2022-04-20 MED ORDER — VALACYCLOVIR HCL 500 MG PO TABS: ORAL_TABLET | ORAL | 3 refills | Status: DC

## 2022-05-15 ENCOUNTER — Emergency Department
Admission: EM | Admit: 2022-05-15 | Discharge: 2022-05-15 | Disposition: A | Discharge status: Home / Self Care | Payer: BC Managed Care – PPO | Attending: Emergency Medicine | Admitting: Emergency Medicine

## 2022-05-15 ENCOUNTER — Emergency Department: Payer: BC Managed Care – PPO

## 2022-05-15 DIAGNOSIS — Y92410 Unspecified street and highway as the place of occurrence of the external cause: Secondary | ICD-10-CM | POA: Insufficient documentation

## 2022-05-15 DIAGNOSIS — M19012 Primary osteoarthritis, left shoulder: Secondary | ICD-10-CM | POA: Diagnosis not present

## 2022-05-15 DIAGNOSIS — R0781 Pleurodynia: Secondary | ICD-10-CM | POA: Insufficient documentation

## 2022-05-15 DIAGNOSIS — M79621 Pain in right upper arm: Secondary | ICD-10-CM | POA: Insufficient documentation

## 2022-05-15 DIAGNOSIS — M545 Low back pain, unspecified: Secondary | ICD-10-CM | POA: Insufficient documentation

## 2022-05-15 DIAGNOSIS — G8929 Other chronic pain: Secondary | ICD-10-CM | POA: Diagnosis not present

## 2022-05-15 DIAGNOSIS — M79601 Pain in right arm: Secondary | ICD-10-CM

## 2022-05-15 MED ORDER — METHOCARBAMOL 500 MG PO TABS
500.0000 mg | ORAL_TABLET | ORAL | Status: AC
  Administered 2022-05-15: 500 mg via ORAL
  Filled 2022-05-15: qty 1

## 2022-05-15 MED ORDER — NAPROXEN 500 MG PO TABS
500.0000 mg | ORAL_TABLET | Freq: Once | ORAL | Status: AC
Start: 2022-05-15 — End: 2022-05-15
  Administered 2022-05-15: 500 mg via ORAL
  Filled 2022-05-15: qty 1

## 2022-05-15 MED ORDER — METHOCARBAMOL 500 MG PO TABS
500.0000 mg | ORAL_TABLET | Freq: Four times a day (QID) | ORAL | 0 refills | Status: AC | PRN
Start: 2022-05-15 — End: ?

## 2022-05-15 MED ORDER — NAPROXEN 500 MG PO TABS: 500.0000 mg | ORAL_TABLET | Freq: Two times a day (BID) | ORAL | 0 refills | Status: DC
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
## 2022-05-15 NOTE — Discharge Instructions (Addendum)
Take the medication as prescribed. While taking the methocarbamol (Robaxin) do not take the tizanidine that was prescribed by a previous provider.  Follow up with primary care if not improving over the week.  Return to the ER for symptoms that change or worsen if unable to schedule an appointment.
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
## 2022-05-15 NOTE — ED Notes (Signed)
E signature pad not working. Pt educated on discharge instructions and verbalized understanding.  

## 2022-06-09 ENCOUNTER — Ambulatory Visit: Payer: BC Managed Care – PPO | Admitting: Internal Medicine

## 2022-08-21 ENCOUNTER — Encounter: Payer: Self-pay | Admitting: *Deleted

## 2022-08-21 ENCOUNTER — Other Ambulatory Visit: Payer: Self-pay | Admitting: *Deleted

## 2022-08-21 NOTE — Telephone Encounter (Signed)
Opened in error

## 2022-10-07 NOTE — Progress Notes (Unsigned)
  Tomasita Morrow, NP-C Phone: [phone]  Courtney Ward is a 57 y.o. female who presents today for transfer of care.   HYPOTHYROIDISM Disease Monitoring Weight changes: ***  Skin Changes: *** Palpitations: *** Heat/Cold intolerance: *** Medication Monitoring Compliance:  ***   Last TSH:   Lab Results  Component Value Date   TSH 6.260 (H) 08/02/2019   GERD:   Reflux symptoms: ***   Abd pain: ***   Blood in stool: ***  Dysphagia: ***   EGD: ***  Medication: ***   Social History   Tobacco Use  Smoking Status Former  Smokeless Tobacco Never  Tobacco Comments   from age 65 to 35 3-4 cig maternal side lung cancer     Current Outpatient Medications on File Prior to Visit  Medication Sig Dispense Refill   Calcium Carbonate (CALCIUM-CARB 600 PO) Take 2 tablets by mouth daily.     Cholecalciferol (VITAMIN D3 GUMMIES PO) Take 5,000 Int'l Units by mouth.     cyanocobalamin (,VITAMIN B-12,) 1000 MCG/ML injection Inject into the muscle.     famotidine (PEPCID) 40 MG tablet 1 tablet at bedtime.     fluticasone (FLONASE) 50 MCG/ACT nasal spray SHAKE LIQUID AND USE 2 SPRAYS IN EACH NOSTRIL DAILY AS NEEDED FOR ALLERGIES OR RHINITIS 16 g 11   folic acid (FOLVITE) 1 MG tablet 1 tablet     hydroxychloroquine (PLAQUENIL) 200 MG tablet Take 200 mg by mouth 2 (two) times daily.      ibuprofen (ADVIL) 600 MG tablet Take 1 tablet (600 mg total) by mouth every 6 (six) hours as needed. 30 tablet 0   levothyroxine (SYNTHROID) 88 MCG tablet Take 1 tablet (88 mcg total) by mouth daily before breakfast. 90 tablet 3   levothyroxine (SYNTHROID) 88 MCG tablet Take by mouth.     lidocaine (LIDODERM) 5 % Place 1 patch onto the skin daily. Remove & Discard patch within 12 hours or as directed by MD 15 patch 0   liothyronine (CYTOMEL) 25 MCG tablet 1/2 daily.     methocarbamol (ROBAXIN) 500 MG tablet Take 1 tablet (500 mg total) by mouth every 6 (six) hours as needed for muscle spasms. 30 tablet 0    naproxen (NAPROSYN) 500 MG tablet Take 1 tablet (500 mg total) by mouth 2 (two) times daily with a meal. 30 tablet 0   UNABLE TO FIND 1 tablet daily. Amberen Menopausal Relief     valACYclovir (VALTREX) 500 MG tablet TAKE 1 TABLET BY MOUTH EVERY DAY suppression, TWICE DAILY FOR 3 TO 7 DAYS WITH OUTBREAK 180 tablet 3   No current facility-administered medications on file prior to visit.     ROS see history of present illness  Objective  Physical Exam There were no vitals filed for this visit.  BP Readings from Last 3 Encounters:  05/15/22 (!) 141/86  11/04/21 118/70  10/17/21 110/70   Wt Readings from Last 3 Encounters:  11/04/21 173 lb 12.8 oz (78.8 kg)  10/17/21 171 lb 4 oz (77.7 kg)  09/24/20 165 lb (74.8 kg)    Physical Exam   Assessment/Plan: Please see individual problem list.  There are no diagnoses linked to this encounter.   Health Maintenance: ***  No follow-ups on file.   Tomasita Morrow, NP-C [place]

## 2022-10-08 ENCOUNTER — Other Ambulatory Visit: Payer: Self-pay | Admitting: Nurse Practitioner

## 2022-10-08 ENCOUNTER — Encounter: Payer: Self-pay | Admitting: Nurse Practitioner

## 2022-10-08 ENCOUNTER — Ambulatory Visit: Payer: BC Managed Care – PPO | Admitting: Nurse Practitioner

## 2022-10-08 VITALS — BP 118/70 | HR 56 | Temp 99.4°F | Ht 64.0 in | Wt 182.6 lb

## 2022-10-08 DIAGNOSIS — Z0000 Encounter for general adult medical examination without abnormal findings: Secondary | ICD-10-CM

## 2022-10-08 DIAGNOSIS — Z13220 Encounter for screening for lipoid disorders: Secondary | ICD-10-CM

## 2022-10-08 DIAGNOSIS — E559 Vitamin D deficiency, unspecified: Secondary | ICD-10-CM

## 2022-10-08 DIAGNOSIS — J0100 Acute maxillary sinusitis, unspecified: Secondary | ICD-10-CM | POA: Diagnosis not present

## 2022-10-08 DIAGNOSIS — E890 Postprocedural hypothyroidism: Secondary | ICD-10-CM

## 2022-10-08 DIAGNOSIS — K219 Gastro-esophageal reflux disease without esophagitis: Secondary | ICD-10-CM | POA: Diagnosis not present

## 2022-10-08 DIAGNOSIS — E538 Deficiency of other specified B group vitamins: Secondary | ICD-10-CM

## 2022-10-08 DIAGNOSIS — Z1231 Encounter for screening mammogram for malignant neoplasm of breast: Secondary | ICD-10-CM

## 2022-10-08 DIAGNOSIS — B009 Herpesviral infection, unspecified: Secondary | ICD-10-CM

## 2022-10-08 DIAGNOSIS — N921 Excessive and frequent menstruation with irregular cycle: Secondary | ICD-10-CM | POA: Insufficient documentation

## 2022-10-08 DIAGNOSIS — R7303 Prediabetes: Secondary | ICD-10-CM

## 2022-10-08 MED ORDER — VALACYCLOVIR HCL 500 MG PO TABS: ORAL_TABLET | ORAL | 3 refills | Status: AC

## 2022-10-08 MED ORDER — AMOXICILLIN-POT CLAVULANATE 875-125 MG PO TABS: 1.0000 | ORAL_TABLET | Freq: Two times a day (BID) | ORAL | 0 refills | Status: DC

## 2022-10-08 MED ORDER — FAMOTIDINE 40 MG PO TABS: 40.0000 mg | ORAL_TABLET | Freq: Every day | ORAL | 3 refills | Status: DC

## 2022-10-08 NOTE — Assessment & Plan Note (Signed)
Symptoms and exam consistent with sinusitis. Will treat with Augmentin 875 BID x 10 days. Advised patient she can continue Mucinex and Flonase PRN. Encouraged adequate hydration.

## 2022-10-08 NOTE — Assessment & Plan Note (Signed)
Chronic. Stable on Levothyroxine 88 mcg daily. Continue. Follow up with Endocrinology in May as scheduled.

## 2022-10-08 NOTE — Assessment & Plan Note (Addendum)
Chronic. Had been out of Pepcid, therefore began having increased symptoms. Refills sent. Continue Pepcid 40 mg daily. Will monitor.

## 2022-12-08 LAB — HM DIABETES EYE EXAM

## 2023-01-08 ENCOUNTER — Ambulatory Visit
Admission: RE | Admit: 2023-01-08 | Discharge: 2023-01-08 | Disposition: A | Discharge status: Home / Self Care | Payer: BC Managed Care – PPO | Source: Ambulatory Visit | Attending: Nurse Practitioner | Admitting: Nurse Practitioner

## 2023-01-08 DIAGNOSIS — Z1231 Encounter for screening mammogram for malignant neoplasm of breast: Secondary | ICD-10-CM | POA: Insufficient documentation

## 2023-02-15 ENCOUNTER — Encounter: Payer: Self-pay | Admitting: Nurse Practitioner

## 2023-02-27 ENCOUNTER — Ambulatory Visit
Admission: EM | Admit: 2023-02-27 | Discharge: 2023-02-27 | Disposition: A | Discharge status: Home / Self Care | Payer: BC Managed Care – PPO

## 2023-02-27 ENCOUNTER — Encounter: Payer: Self-pay | Admitting: Emergency Medicine

## 2023-02-27 DIAGNOSIS — T63481A Toxic effect of venom of other arthropod, accidental (unintentional), initial encounter: Secondary | ICD-10-CM

## 2023-02-27 MED ORDER — PREDNISONE 10 MG (21) PO TBPK: ORAL_TABLET | Freq: Every day | ORAL | 0 refills | Status: DC

## 2023-02-27 NOTE — Discharge Instructions (Signed)
Recommend use of oral antihistamines such as Benadryl, Zyrtec, Allegra.  Make an ink mark around each of the reddened areas at the insect bite.  If the area of redness spreads significantly outside of the ink mark, start using the prednisone which was prescribed.  Follow up here or with your primary care provider if your symptoms are worsening or not improving with treatment.

## 2023-02-27 NOTE — ED Provider Notes (Signed)
Renaldo Fiddler    CSN: [id number] Arrival date & time: 02/27/23  1159      History   Chief Complaint Chief Complaint  Patient presents with   Rash    HPI Loriene VANAE ZITTLE is a 57 y.o. female.    Rash   Presents to urgent care with concern for a bite on her left wrist and right shoulder.  She reports a lesion on her wrist as well as 2 spots on her shoulder.  Patient states she awoke about 4 AM this morning and felt something on her wrist and possibly something crawling on her shoulder.  She has been applying Benadryl cream on the lesions.  Reports recently returned from a weeklong vacation [place] an [employer]. Inspection of bedding and clothing did not locate an insect.   Past Medical History:  Diagnosis Date   Allergy    Arthritis    Chicken pox    Chronic constipation    Colon polyps    Complication of anesthesia    per pt report she is "difficult to wake up"   Connective tissue disease (HCC)    CTS (carpal tunnel syndrome)    right   CTS (carpal tunnel syndrome)    right    Endometriosis    Fibrocystic breast changes    Frozen shoulder syndrome    right   GERD (gastroesophageal reflux disease)    Heart murmur    MVP   HSV infection    Migraines    Multinodular goiter    MVP (mitral valve prolapse)    Thyroid disease    Tubal ectopic pregnancy    Uterine fibroid    UTI (urinary tract infection)     Patient Active Problem List   Diagnosis Date Noted   Breakthrough bleeding on OCPs 10/08/2022   Acute non-recurrent maxillary sinusitis 10/08/2022   Arthritis of left shoulder region 11/04/2021   Abnormal MRI, cervical spine 11/04/2021   Degenerative cervical disc 11/04/2021   Foraminal stenosis of cervical region 11/04/2021   Chronic midline low back pain 11/04/2021   Left shoulder pain 10/15/2021   Chest heaviness 10/14/2021   B12 deficiency 08/09/2020   Personal history of colonic polyps    Benign neoplasm of cecum    Trigger finger of right  thumb 12/30/2018   Annual physical exam 07/01/2018   Endometriosis 07/01/2018   Female pelvic pain 07/01/2018   Cervical polyp 07/01/2018   Mass of right axilla 03/15/2018   Prediabetes 03/15/2018   HSV infection 03/15/2018   Hypothyroidism 03/08/2018   MCTD (mixed connective tissue disease) (HCC) 03/08/2018   MVP (mitral valve prolapse) 03/08/2018   History of migraine 03/08/2018   GERD (gastroesophageal reflux disease) 03/08/2018   Palpitations 02/06/2014   Arthropathic psoriasis, unspecified (HCC) 11/09/2011   Gastro-esophageal reflux disease with esophagitis 11/09/2011   Postoperative hypothyroidism 11/09/2011   Diffuse cystic mastopathy 12/11/2008   Angiodysplasia of stomach and duodenum 06/07/2007   Leiomyoma of uterus 06/07/2007    Past Surgical History:  Procedure Laterality Date   ABLATION ON ENDOMETRIOSIS  2019   APPENDECTOMY     2005-2010 dx lap with appendectomy lysis of adhesions removal right twisted paraovarian cyst    CESAREAN SECTION     1982   COLONOSCOPY WITH PROPOFOL N/A 01/27/2019   Procedure: COLONOSCOPY WITH BIOPSY;  Surgeon: Midge Minium, MD;  Location: [facility];  Service: Endoscopy;  Laterality: N/A;   ECTOPIC PREGNANCY SURGERY     2002 surgery to repair  laparascopy     08/2017 colon attached to bladder 08/2017 h/o endometriosis    LAPAROSCOPIC OOPHERECTOMY     left 1996   POLYPECTOMY N/A 01/27/2019   Procedure: POLYPECTOMY;  Surgeon: Midge Minium, MD;  Location: [facility];  Service: Endoscopy;  Laterality: N/A;   THYROIDECTOMY     2007   TOE SURGERY     TONSILLECTOMY     1986   TUBAL LIGATION     2001   UTERINE FIBROID SURGERY     2004    OB History   No obstetric history on file.      Home Medications    Prior to Admission medications   Medication Sig Start Date End Date Taking? Authorizing Provider  phentermine (ADIPEX-P) 37.5 MG tablet Take by mouth. 01/06/23  Yes [provider]   amoxicillin-clavulanate (AUGMENTIN) 875-125 MG tablet Take 1 tablet by mouth 2 (two) times daily. 10/08/22   Bethanie Dicker, NP  Calcium Carbonate (CALCIUM-CARB 600 PO) Take 2 tablets by mouth daily.    [provider]  Cholecalciferol (VITAMIN D3 GUMMIES PO) Take 5,000 Int'l Units by mouth.    [provider]  cyanocobalamin (VITAMIN B12) 1000 MCG tablet Take 1,000 mcg by mouth daily.    [provider]  famotidine (PEPCID) 40 MG tablet Take 1 tablet (40 mg total) by mouth at bedtime. 10/08/22   Bethanie Dicker, NP  fluticasone (FLONASE) 50 MCG/ACT nasal spray SHAKE LIQUID AND USE 2 SPRAYS IN EACH NOSTRIL DAILY AS NEEDED FOR ALLERGIES OR RHINITIS 01/01/22   McLean-Scocuzza, Pasty Spillers, MD  folic acid (FOLVITE) 1 MG tablet 1 tablet 05/23/21   [provider]  hydroxychloroquine (PLAQUENIL) 200 MG tablet Take 200 mg by mouth 2 (two) times daily.  11/09/11   [provider]  ibuprofen (ADVIL) 600 MG tablet Take 1 tablet (600 mg total) by mouth every 6 (six) hours as needed. 09/09/21   Domenick Gong, MD  levothyroxine (SYNTHROID) 88 MCG tablet Take 1 tablet (88 mcg total) by mouth daily before breakfast. 12/30/18   McLean-Scocuzza, Pasty Spillers, MD  lidocaine (LIDODERM) 5 % Place 1 patch onto the skin daily. Remove & Discard patch within 12 hours or as directed by MD 06/11/21   Domenick Gong, MD  liothyronine (CYTOMEL) 25 MCG tablet 1/2 daily. 11/23/17   [provider]  methocarbamol (ROBAXIN) 500 MG tablet Take 1 tablet (500 mg total) by mouth every 6 (six) hours as needed for muscle spasms. 05/15/22   Triplett, Cari B, FNP  UNABLE TO FIND 1 tablet daily. Amberen Menopausal Relief    [provider]  valACYclovir (VALTREX) 500 MG tablet TAKE 1 TABLET BY MOUTH EVERY DAY suppression, TWICE DAILY FOR 3 TO 7 DAYS WITH OUTBREAK 10/08/22   Bethanie Dicker, NP    Family History Family History  Problem Relation Age of Onset   Arthritis Mother    Depression  Mother    Diabetes Mother    Heart disease Mother 31   Hyperlipidemia Mother    Hypertension Mother    Kidney disease Mother    Stroke Mother    Miscarriages / [place] Mother    Arthritis Father    Diabetes Father    Heart disease Father 77   Hyperlipidemia Father    Hypertension Father    Heart attack Father 24   Arthritis Sister    Asthma Sister    Depression Sister    Drug abuse Sister    Hyperlipidemia Sister  Hypertension Sister    Learning disabilities Sister    Mental illness Sister    Alcohol abuse Brother    Arthritis Brother    COPD Brother    Depression Brother    Drug abuse Brother    Mental illness Brother    Alcohol abuse Brother    Arthritis Brother    Depression Brother    Diabetes Brother    Drug abuse Brother    Hyperlipidemia Brother    Hypertension Brother    Learning disabilities Brother    Mental illness Brother    Alcohol abuse Brother    Arthritis Brother    COPD Brother    Depression Brother    Diabetes Brother    Drug abuse Brother    Hyperlipidemia Brother    Hypertension Brother    Arthritis Maternal Grandmother    Asthma Maternal Grandmother    Cancer Maternal Grandmother        bone   Depression Maternal Grandmother    Drug abuse Maternal Grandmother    Heart disease Maternal Grandmother    Hyperlipidemia Maternal Grandmother    Hypertension Maternal Grandmother    Miscarriages / Stillbirths Maternal Grandmother    Alcohol abuse Maternal Grandfather    Arthritis Maternal Grandfather    Cancer Maternal Grandfather        prostate   COPD Maternal Grandfather    COPD Paternal Grandmother    Hyperlipidemia Paternal Grandmother    Hypertension Paternal Grandmother    Miscarriages / Stillbirths Paternal Grandmother    Arthritis Daughter    Depression Daughter    Hypertension Daughter    Asthma Son    Breast cancer Cousin 13   Breast cancer Cousin 55    Social History Social History   Tobacco Use   Smoking  status: Former   Smokeless tobacco: Never   Tobacco comments:    from age 70 to 24 3-4 cig maternal side lung cancer   Vaping Use   Vaping Use: Never used  Substance Use Topics   Alcohol use: Not Currently   Drug use: Not Currently     Allergies   Cortisone, Sulfa antibiotics, Sulfamethoxazole, Tramadol, Tramadol hcl, and Codeine   Review of Systems Review of Systems  Skin:  Positive for rash.     Physical Exam Triage Vital Signs ED Triage Vitals [02/27/23 1224]  Enc Vitals Group     BP 121/76     Pulse Rate 80     Resp 16     Temp 98.7 F (37.1 C)     Temp Source Oral     SpO2 98 %     Weight      Height      Head Circumference      Peak Flow      Pain Score 6     Pain Loc      Pain Edu?      Excl. in GC?    No data found.  Updated Vital Signs BP 121/76 (BP Location: Left Arm)   Pulse 80   Temp 98.7 F (37.1 C) (Oral)   Resp 16   SpO2 98%   Visual Acuity Right Eye Distance:   Left Eye Distance:   Bilateral Distance:    Right Eye Near:   Left Eye Near:    Bilateral Near:     Physical Exam Vitals reviewed.  Constitutional:      Appearance: Normal appearance.  Musculoskeletal:       Arms:  Skin:    General: Skin is warm and dry.     Findings: Lesion present.  Neurological:     General: No focal deficit present.     Mental Status: She is alert.  Psychiatric:        Mood and Affect: Mood normal.      UC Treatments / Results  Labs (all labs ordered are listed, but only abnormal results are displayed) Labs Reviewed - No data to display  EKG   Radiology No results found.  Procedures Procedures (including critical care time)  Medications Ordered in UC Medications - No data to display  Initial Impression / Assessment and Plan / UC Course  I have reviewed the triage vital signs and the nursing notes.  Pertinent labs & imaging results that were available during my care of the patient were reviewed by me and considered in my  medical decision making (see chart for details).   Starlynn LATRICIA VALLANCE is a 57 y.o. female presenting with apparent insect bites. Patient is afebrile without recent antipyretics, satting well on room air. Overall is well appearing, well hydrated, without respiratory distress.  3 areas of erythema are present, left wrist, right deltoid, right clavicle.  Somewhat warm to the touch as well as firm.  Reviewed relevant chart history.   Local inflammatory reaction to presumed insect bite.  Recommended that patient make an ink mark around the border of each of the 3 lesions.  Suggesting initial treatment with oral antihistamines such as Benadryl or Zyrtec.  If the lesions continue to spread outside the marked area, initiation of a prednisone taper is recommended.  Paper prescription provided.  Counseled patient on potential for adverse effects with medications prescribed/recommended today, ER and return-to-clinic precautions discussed, patient verbalized understanding and agreement with care plan.  Final Clinical Impressions(s) / UC Diagnoses   Final diagnoses:  None   Discharge Instructions   None    ED Prescriptions   None    PDMP not reviewed this encounter.   Charma Igo, [place] 02/27/23 1306

## 2023-02-27 NOTE — ED Triage Notes (Signed)
Woke up at 4 am and felt something like a bite on her left wrist, possibly something crawling on her shoulder. Noticed a new wheal on left wrist, as well as two spots on her right shoulder. Has been using benadryl cream on them today to treat the areas. States she just got back from a week long vacation.

## 2023-03-02 ENCOUNTER — Other Ambulatory Visit: Payer: Self-pay

## 2023-03-02 MED ORDER — FLUTICASONE PROPIONATE 50 MCG/ACT NA SUSP: NASAL | 11 refills | Status: AC

## 2023-03-02 NOTE — Telephone Encounter (Signed)
Received e fax refill request for flonase nasal spray from [facility]. Refill has been sent.

## 2023-03-17 ENCOUNTER — Telehealth: Payer: Self-pay | Admitting: Nurse Practitioner

## 2023-03-17 NOTE — Telephone Encounter (Signed)
Patient states she is returning call from Sandy Salaam, CMA.  I transferred call to [place].

## 2023-03-17 NOTE — Telephone Encounter (Signed)
Spoke with pt and she stated that she will call be back about 10 minutes after 4pm.

## 2023-03-17 NOTE — Telephone Encounter (Signed)
This message is being sent to the doc of the day.  Patient called to make an appointment to see her provider. When patient was ask what she needed to be seen for she hesitated to say, but did say for issues with depression. Patient was offered an appointment with Evelene Croon on 03/18/2023. Patient ask for an appointment on Friday, 03/19/23. During phone call patient sound like she was shuffling around in a drawer, she did not sound right. Front office would like someone medical to check on her before Friday appt.

## 2023-03-17 NOTE — Telephone Encounter (Signed)
Spoke with pt and she stated that she is having a lot of anxiety that is related to work. She stated that her boss is trying everything to get her to quit her job. Pt stated that she feels like she is being threaten. She stated that her boss is telling her that she does not have the image for the company, she is too slow and yesterday her boss came to her and said if you don't find a new job or see if someone here can help you find a new job then she was going to write her up. Pt stated that she had to go into work this morning with an answer on what she was going to do. Pt stated she spoke with a counselor and they advised her not to use the company to find a new job to let them help her find a new job, so pt went in this morning to tell her boss. Pt stated that her boss has been better today since she told her. Pt has no thoughts of harming herself or anyone. She stated that "that I just want this to stop". Pt has an appt scheduled for Friday with Kara Dies, NP. Pt is fine to wait till Friday.

## 2023-03-18 NOTE — Telephone Encounter (Signed)
Noted  

## 2023-03-19 ENCOUNTER — Ambulatory Visit: Payer: BC Managed Care – PPO | Admitting: Nurse Practitioner

## 2023-03-19 ENCOUNTER — Encounter: Payer: Self-pay | Admitting: Nurse Practitioner

## 2023-03-19 VITALS — BP 130/64 | HR 74 | Temp 97.8°F | Resp 16 | Ht 64.0 in | Wt 181.0 lb

## 2023-03-19 DIAGNOSIS — F339 Major depressive disorder, recurrent, unspecified: Secondary | ICD-10-CM

## 2023-03-19 DIAGNOSIS — J329 Chronic sinusitis, unspecified: Secondary | ICD-10-CM | POA: Diagnosis not present

## 2023-03-19 DIAGNOSIS — F419 Anxiety disorder, unspecified: Secondary | ICD-10-CM | POA: Diagnosis not present

## 2023-03-19 MED ORDER — AMOXICILLIN-POT CLAVULANATE 875-125 MG PO TABS: 1.0000 | ORAL_TABLET | Freq: Two times a day (BID) | ORAL | 0 refills | Status: AC

## 2023-03-19 MED ORDER — HYDROXYZINE PAMOATE 25 MG PO CAPS: 25.0000 mg | ORAL_CAPSULE | Freq: Three times a day (TID) | ORAL | 0 refills | Status: AC | PRN

## 2023-03-19 MED ORDER — PREDNISONE 20 MG PO TABS: 20.0000 mg | ORAL_TABLET | Freq: Every day | ORAL | 0 refills | Status: AC

## 2023-03-19 NOTE — Progress Notes (Signed)
Established Patient Office Visit  Subjective:  Patient ID: Courtney Ward, female    DOB: July 01, 1966  Age: 57 y.o. MRN: [id number]  CC:  Chief Complaint  Patient presents with   Depression    More then 1 month   Sinus Problem    X 1 week pressure in face and coughing up yellow mucus.    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 She changed her job in February but since she change her job she is having some problem with her boss. She states that has lot of anxiety related to work. She also states that her boss wants her to quit the job.  She states that her boss told her to find a new job else she is going to write her up. Pt states that she contacted the counselor and they are trying to find a new job.  The patient states that when she told her boss that she is trying to find new  job her boss reaction changed and started to be very friendly with her.  Patient states that she has anxiety and depression levels are on the on and she would like to see a counselor. She also states that she has sinus pressure and cough from a week.  Denies fever, shortness of breath or chest pain.   HPI   Past Medical History:  Diagnosis Date   Allergy    Arthritis    Chicken pox    Chronic constipation    Colon polyps    Complication of anesthesia    per pt report she is "difficult to wake up"   Connective tissue disease (HCC)    CTS (carpal tunnel syndrome)    right   CTS (carpal tunnel syndrome)    right    Endometriosis    Fibrocystic breast changes    Frozen shoulder syndrome    right   GERD (gastroesophageal reflux disease)    Heart murmur    MVP   HSV infection    Migraines    Multinodular goiter    MVP (mitral valve prolapse)    Thyroid disease    Tubal ectopic pregnancy    Uterine fibroid    UTI (urinary tract infection)     Past Surgical History:  Procedure Laterality Date   ABLATION ON ENDOMETRIOSIS  2019   APPENDECTOMY     2005-2010 dx  lap with appendectomy lysis of adhesions removal right twisted paraovarian cyst    CESAREAN SECTION     1982   COLONOSCOPY WITH PROPOFOL N/A 01/27/2019   Procedure: COLONOSCOPY WITH BIOPSY;  Surgeon: Midge Minium, MD;  Location: [facility];  Service: Endoscopy;  Laterality: N/A;   ECTOPIC PREGNANCY SURGERY     2002 surgery to repair    laparascopy     08/2017 colon attached to bladder 08/2017 h/o endometriosis    LAPAROSCOPIC OOPHERECTOMY     left 1996   POLYPECTOMY N/A 01/27/2019   Procedure: POLYPECTOMY;  Surgeon: Midge Minium, MD;  Location: [facility];  Service: Endoscopy;  Laterality: N/A;   THYROIDECTOMY     2007   TOE SURGERY     TONSILLECTOMY     1986   TUBAL LIGATION     2001   UTERINE FIBROID SURGERY     2004    Family History  Problem Relation Age of Onset   Arthritis Mother    Depression Mother    Diabetes Mother    Heart disease  Mother 16   Hyperlipidemia Mother    Hypertension Mother    Kidney disease Mother    Stroke Mother    Miscarriages / [place] Mother    Arthritis Father    Diabetes Father    Heart disease Father 27   Hyperlipidemia Father    Hypertension Father    Heart attack Father 63   Arthritis Sister    Asthma Sister    Depression Sister    Drug abuse Sister    Hyperlipidemia Sister    Hypertension Sister    Learning disabilities Sister    Mental illness Sister    Alcohol abuse Brother    Arthritis Brother    COPD Brother    Depression Brother    Drug abuse Brother    Mental illness Brother    Alcohol abuse Brother    Arthritis Brother    Depression Brother    Diabetes Brother    Drug abuse Brother    Hyperlipidemia Brother    Hypertension Brother    Learning disabilities Brother    Mental illness Brother    Alcohol abuse Brother    Arthritis Brother    COPD Brother    Depression Brother    Diabetes Brother    Drug abuse Brother    Hyperlipidemia Brother    Hypertension Brother    Arthritis Maternal  Grandmother    Asthma Maternal Grandmother    Cancer Maternal Grandmother        bone   Depression Maternal Grandmother    Drug abuse Maternal Grandmother    Heart disease Maternal Grandmother    Hyperlipidemia Maternal Grandmother    Hypertension Maternal Grandmother    Miscarriages / Stillbirths Maternal Grandmother    Alcohol abuse Maternal Grandfather    Arthritis Maternal Grandfather    Cancer Maternal Grandfather        prostate   COPD Maternal Grandfather    COPD Paternal Grandmother    Hyperlipidemia Paternal Grandmother    Hypertension Paternal Grandmother    Miscarriages / Stillbirths Paternal Grandmother    Arthritis Daughter    Depression Daughter    Hypertension Daughter    Asthma Son    Breast cancer Cousin 68   Breast cancer Cousin 43    Social History   Socioeconomic History   Marital status: Divorced    Spouse name: Not on file   Number of children: Not on file   Years of education: Not on file   Highest education level: Not on file  Occupational History   Not on file  Tobacco Use   Smoking status: Former   Smokeless tobacco: Never   Tobacco comments:    from age 11 to 29 3-4 cig maternal side lung cancer   Vaping Use   Vaping status: Never Used  Substance and Sexual Activity   Alcohol use: Not Currently   Drug use: Not Currently   Sexual activity: Yes    Comment: men  Other Topics Concern   Not on file  Social History Narrative   No guns    Wears seat belt    Safe in relationship    Divorced 4 pregnancies 2 live births    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     Cholecalciferol (VITAMIN D3 GUMMIES PO) Take 5,000 Int'l Units by mouth.     cyanocobalamin (VITAMIN B12) 1000 MCG tablet Take 1,000 mcg by mouth daily.     famotidine (PEPCID) 40 MG tablet Take 1 tablet (40 mg total) by mouth at bedtime. 90 tablet 3   fluticasone (FLONASE) 50 MCG/ACT nasal spray SHAKE LIQUID AND USE 2 SPRAYS IN EACH NOSTRIL DAILY AS NEEDED FOR ALLERGIES OR RHINITIS 16 g 11   folic acid (FOLVITE) 1 MG tablet 1 tablet     hydroxychloroquine (PLAQUENIL) 200 MG tablet Take 200 mg by mouth 2 (two) times daily.      ibuprofen (ADVIL) 600 MG tablet Take 1 tablet (600 mg total) by mouth every 6 (six) hours as needed. 30 tablet 0   levothyroxine (SYNTHROID) 88 MCG tablet Take 1 tablet (88 mcg total) by mouth daily before breakfast. 90 tablet 3   lidocaine (LIDODERM) 5 % Place 1 patch onto the skin daily. Remove & Discard patch within 12 hours or as directed by MD 15 patch 0   liothyronine (CYTOMEL) 25 MCG tablet 1/2 daily.     methocarbamol (ROBAXIN) 500 MG tablet Take 1 tablet (500 mg total) by mouth every 6 (six) hours as needed for muscle spasms. 30 tablet 0   valACYclovir (VALTREX) 500 MG tablet TAKE 1 TABLET BY MOUTH EVERY DAY suppression, TWICE DAILY FOR 3 TO 7 DAYS WITH OUTBREAK 180 tablet 3   phentermine (ADIPEX-P) 37.5 MG tablet Take by mouth. (Patient not taking: Reported on 03/19/2023)     UNABLE TO FIND 1 tablet daily. Amberen Menopausal Relief (Patient not taking: Reported on 03/19/2023)     amoxicillin-clavulanate (AUGMENTIN) 875-125 MG tablet Take 1 tablet by mouth 2 (two) times daily. 20 tablet 0   predniSONE (STERAPRED UNI-PAK 21 TAB) 10 MG (21) TBPK tablet Take by mouth daily. Take 6 tabs by mouth daily for 1 day, then 5 tabs for 1 day, then 4 tabs for 1 day, then 3 tabs for 1 day, then 2 tabs for 1 day, then 1 tab by mouth daily for 1 day 21 tablet 0   No facility-administered medications prior to  visit.    Allergies  Allergen Reactions   Cortisone     ER visit    Sulfa Antibiotics Hives    Rash     Sulfamethoxazole Other (See Comments)   Tramadol Other (See Comments)    Dizzy and confused. Stays in system for days incapacitated     Tramadol Hcl Other (See Comments)   Codeine Rash and Other (See Comments)    Nausea, cant move     ROS Review of Systems Negative unless indicated in HPI.    Objective:    Physical Exam HENT:     Right Ear: Tympanic membrane normal. Tympanic membrane is not erythematous.     Left Ear: Tympanic membrane normal. Tympanic membrane is not erythematous.     Nose:     Right Turbinates: Not enlarged.     Left Turbinates: Not enlarged.     Right Sinus: Maxillary sinus tenderness and frontal sinus tenderness present.     Left Sinus: Maxillary sinus tenderness and frontal sinus tenderness present.     Mouth/Throat:     Mouth: Mucous membranes are moist.  Pharynx: No pharyngeal swelling, oropharyngeal exudate or posterior oropharyngeal erythema.     Tonsils: No tonsillar exudate.  Cardiovascular:     Rate and Rhythm: Normal rate and regular rhythm.  Pulmonary:     Effort: Pulmonary effort is normal.     Breath sounds: Normal breath sounds. No stridor. No wheezing.  Neurological:     General: No focal deficit present.     Mental Status: She is oriented to person, place, and time. Mental status is at baseline.  Psychiatric:        Mood and Affect: Mood normal.        Behavior: Behavior normal.        Thought Content: Thought content normal.        Judgment: Judgment normal.     BP 130/64   Pulse 74   Temp 97.8 F (36.6 C)   Resp 16   Ht 5\' 4"  (1.626 m)   Wt 181 lb (82.1 kg)   SpO2 94%   BMI 31.07 kg/m  Wt Readings from Last 3 Encounters:  03/19/23 181 lb (82.1 kg)  10/08/22 182 lb 9.6 oz (82.8 kg)  11/04/21 173 lb 12.8 oz (78.8 kg)     Health Maintenance  Topic Date Due   Zoster Vaccines- Shingrix (1 of 2) Never done    COVID-19 Vaccine (5 - 2023-24 season) 04/24/2022   INFLUENZA VACCINE  03/25/2023   Colonoscopy  01/27/2024   PAP SMEAR-Modifier  07/04/2024   MAMMOGRAM  01/07/2025   DTaP/Tdap/Td (2 - Td or Tdap) 11/12/2029   Hepatitis C Screening  Completed   HIV Screening  Completed   HPV VACCINES  Aged Out    There are no preventive care reminders to display for this patient.  Lab Results  Component Value Date   TSH 0.23 (L) 04/02/2023   Lab Results  Component Value Date   WBC 6.5 04/02/2023   HGB 13.3 04/02/2023   HCT 40.4 04/02/2023   MCV 93.8 04/02/2023   PLT 271.0 04/02/2023   Lab Results  Component Value Date   NA 143 04/02/2023   K 3.8 04/02/2023   CO2 27 04/02/2023   GLUCOSE 102 (H) 04/02/2023   BUN 8 04/02/2023   CREATININE 0.99 04/02/2023   BILITOT 0.6 04/02/2023   ALKPHOS 71 04/02/2023   AST 22 04/02/2023   ALT 19 04/02/2023   PROT 6.9 04/02/2023   ALBUMIN 4.6 04/02/2023   CALCIUM 9.9 04/02/2023   GFR 62.59 04/02/2023   Lab Results  Component Value Date   CHOL 194 04/02/2023   Lab Results  Component Value Date   HDL 74.50 04/02/2023   Lab Results  Component Value Date   LDLCALC 102 (H) 04/02/2023   Lab Results  Component Value Date   TRIG 88.0 04/02/2023   Lab Results  Component Value Date   CHOLHDL 3 04/02/2023   Lab Results  Component Value Date   HGBA1C 5.9 04/02/2023      Assessment & Plan:  Depression, recurrent (HCC) Assessment & Plan: Comes here PHQ-9 score 19  Referral sent to psychiatry  Orders: -     Ambulatory referral to Psychiatry  Anxiety Assessment & Plan: GAD-7 score 14. Advised patient to take hydroxyzine 25 mg as needed for anxiety control. Referral sent to psychiatrist  Orders: -     Ambulatory referral to Psychiatry  Other sinusitis, unspecified chronicity Assessment & Plan: Will treat with Augmentin and prednisone tapering. Advised patient to increase fluid intake. Use salt water gargles  and use  humidifier/steam   Other orders -     predniSONE; Take 1 tablet (20 mg total) by mouth daily with breakfast for 5 days.  Dispense: 5 tablet; Refill: 0 -     Amoxicillin-Pot Clavulanate; Take 1 tablet by mouth 2 (two) times daily for 7 days.  Dispense: 14 tablet; Refill: 0 -     hydrOXYzine Pamoate; Take 1 capsule (25 mg total) by mouth every 8 (eight) hours as needed.  Dispense: 30 capsule; Refill: 0    Follow-up: Return if symptoms worsen or fail to improve.   Kara Dies, NP

## 2023-03-24 ENCOUNTER — Encounter (INDEPENDENT_AMBULATORY_CARE_PROVIDER_SITE_OTHER): Payer: Self-pay

## 2023-04-02 ENCOUNTER — Other Ambulatory Visit: Payer: BC Managed Care – PPO

## 2023-04-02 DIAGNOSIS — E559 Vitamin D deficiency, unspecified: Secondary | ICD-10-CM | POA: Diagnosis not present

## 2023-04-02 DIAGNOSIS — Z13220 Encounter for screening for lipoid disorders: Secondary | ICD-10-CM

## 2023-04-02 DIAGNOSIS — R7303 Prediabetes: Secondary | ICD-10-CM

## 2023-04-02 DIAGNOSIS — E890 Postprocedural hypothyroidism: Secondary | ICD-10-CM | POA: Diagnosis not present

## 2023-04-02 DIAGNOSIS — Z0000 Encounter for general adult medical examination without abnormal findings: Secondary | ICD-10-CM

## 2023-04-02 DIAGNOSIS — E538 Deficiency of other specified B group vitamins: Secondary | ICD-10-CM

## 2023-04-02 LAB — CBC WITH DIFFERENTIAL/PLATELET
Basophils Absolute: 0 10*3/uL (ref 0.0–0.1)
Basophils Relative: 0.5 % (ref 0.0–3.0)
Eosinophils Absolute: 0.1 10*3/uL (ref 0.0–0.7)
Eosinophils Relative: 1.3 % (ref 0.0–5.0)
HCT: 40.4 % (ref 36.0–46.0)
Hemoglobin: 13.3 g/dL (ref 12.0–15.0)
Lymphocytes Relative: 20.7 % (ref 12.0–46.0)
Lymphs Abs: 1.3 10*3/uL (ref 0.7–4.0)
MCHC: 32.8 g/dL (ref 30.0–36.0)
MCV: 93.8 fl (ref 78.0–100.0)
Monocytes Absolute: 0.7 10*3/uL (ref 0.1–1.0)
Monocytes Relative: 10.5 % (ref 3.0–12.0)
Neutro Abs: 4.4 10*3/uL (ref 1.4–7.7)
Neutrophils Relative %: 67 % (ref 43.0–77.0)
Platelets: 271 10*3/uL (ref 150.0–400.0)
RBC: 4.31 Mil/uL (ref 3.87–5.11)
RDW: 13.9 % (ref 11.5–15.5)
WBC: 6.5 10*3/uL (ref 4.0–10.5)

## 2023-04-02 LAB — VITAMIN B12: Vitamin B-12: 1288 pg/mL — ABNORMAL HIGH (ref 211–911)

## 2023-04-02 LAB — COMPREHENSIVE METABOLIC PANEL
ALT: 19 U/L (ref 0–35)
AST: 22 U/L (ref 0–37)
Albumin: 4.6 g/dL (ref 3.5–5.2)
Alkaline Phosphatase: 71 U/L (ref 39–117)
BUN: 8 mg/dL (ref 6–23)
CO2: 27 mEq/L (ref 19–32)
Calcium: 9.9 mg/dL (ref 8.4–10.5)
Chloride: 106 mEq/L (ref 96–112)
Creatinine, Ser: 0.99 mg/dL (ref 0.40–1.20)
GFR: 62.59 mL/min (ref >60.00)
Glucose, Bld: 102 mg/dL — ABNORMAL HIGH (ref 70–99)
Potassium: 3.8 mEq/L (ref 3.5–5.1)
Sodium: 143 mEq/L (ref 135–145)
Total Bilirubin: 0.6 mg/dL (ref 0.2–1.2)
Total Protein: 6.9 g/dL (ref 6.0–8.3)

## 2023-04-02 LAB — HEMOGLOBIN A1C: Hgb A1c MFr Bld: 5.9 % (ref 4.6–6.5)

## 2023-04-02 LAB — VITAMIN D 25 HYDROXY (VIT D DEFICIENCY, FRACTURES): VITD: 41.54 ng/mL (ref 30.00–100.00)

## 2023-04-02 LAB — LIPID PANEL
Cholesterol: 194 mg/dL (ref 0–200)
HDL: 74.5 mg/dL (ref >39.00)
LDL Cholesterol: 102 mg/dL — ABNORMAL HIGH (ref 0–99)
NonHDL: 119.97
Total CHOL/HDL Ratio: 3
Triglycerides: 88 mg/dL (ref 0.0–149.0)
VLDL: 17.6 mg/dL (ref 0.0–40.0)

## 2023-04-02 LAB — TSH: TSH: 0.23 u[IU]/mL — ABNORMAL LOW (ref 0.35–5.50)

## 2023-04-04 DIAGNOSIS — J329 Chronic sinusitis, unspecified: Secondary | ICD-10-CM | POA: Insufficient documentation

## 2023-04-04 DIAGNOSIS — F339 Major depressive disorder, recurrent, unspecified: Secondary | ICD-10-CM | POA: Insufficient documentation

## 2023-04-04 DIAGNOSIS — F419 Anxiety disorder, unspecified: Secondary | ICD-10-CM | POA: Insufficient documentation

## 2023-04-04 NOTE — Assessment & Plan Note (Signed)
GAD-7 score 14. Advised patient to take hydroxyzine 25 mg as needed for anxiety control. Referral sent to psychiatrist

## 2023-04-04 NOTE — Assessment & Plan Note (Signed)
Will treat with Augmentin and prednisone tapering. Advised patient to increase fluid intake. Use salt water gargles and use humidifier/steam

## 2023-04-04 NOTE — Assessment & Plan Note (Signed)
Comes here PHQ-9 score 19  Referral sent to psychiatry

## 2023-04-09 ENCOUNTER — Encounter: Payer: BC Managed Care – PPO | Admitting: Nurse Practitioner

## 2023-04-13 NOTE — Progress Notes (Unsigned)
Bethanie Dicker, NP-C Phone: [phone]  Courtney Ward is a 57 y.o. female who presents today for annual exam. She completed lab work prior to her appointment and would like to review the results today.   Diet: *** Exercise: *** Pap smear: 07/04/2021 Colonoscopy: 01/27/2019- 5 year recall Mammogram: 01/08/2023 Family history-  Colon cancer: ***  Breast cancer: ***  Ovarian cancer: *** Menses: *** Sexually active: *** Vaccines-   Flu: UTD  Tetanus: 11/13/2019  Shingles: ***  COVID19: x 4 HIV screening: Negative Hep C Screening: Negative Tobacco use: *** Alcohol use: *** Illicit Drug use: *** Dentist: *** Ophthalmology: ***   Social History   Tobacco Use  Smoking Status Former  Smokeless Tobacco Never  Tobacco Comments   from age 1 to 15 3-4 cig maternal side lung cancer     Current Outpatient Medications on File Prior to Visit  Medication Sig Dispense Refill   Calcium Carbonate (CALCIUM-CARB 600 PO) Take 2 tablets by mouth daily.     Cholecalciferol (VITAMIN D3 GUMMIES PO) Take 5,000 Int'l Units by mouth.     cyanocobalamin (VITAMIN B12) 1000 MCG tablet Take 1,000 mcg by mouth daily.     famotidine (PEPCID) 40 MG tablet Take 1 tablet (40 mg total) by mouth at bedtime. 90 tablet 3   fluticasone (FLONASE) 50 MCG/ACT nasal spray SHAKE LIQUID AND USE 2 SPRAYS IN EACH NOSTRIL DAILY AS NEEDED FOR ALLERGIES OR RHINITIS 16 g 11   folic acid (FOLVITE) 1 MG tablet 1 tablet     hydroxychloroquine (PLAQUENIL) 200 MG tablet Take 200 mg by mouth 2 (two) times daily.      hydrOXYzine (VISTARIL) 25 MG capsule Take 1 capsule (25 mg total) by mouth every 8 (eight) hours as needed. 30 capsule 0   ibuprofen (ADVIL) 600 MG tablet Take 1 tablet (600 mg total) by mouth every 6 (six) hours as needed. 30 tablet 0   levothyroxine (SYNTHROID) 88 MCG tablet Take 1 tablet (88 mcg total) by mouth daily before breakfast. 90 tablet 3   lidocaine (LIDODERM) 5 % Place 1 patch onto the skin  daily. Remove & Discard patch within 12 hours or as directed by MD 15 patch 0   liothyronine (CYTOMEL) 25 MCG tablet 1/2 daily.     methocarbamol (ROBAXIN) 500 MG tablet Take 1 tablet (500 mg total) by mouth every 6 (six) hours as needed for muscle spasms. 30 tablet 0   phentermine (ADIPEX-P) 37.5 MG tablet Take by mouth. (Patient not taking: Reported on 03/19/2023)     UNABLE TO FIND 1 tablet daily. Amberen Menopausal Relief (Patient not taking: Reported on 03/19/2023)     valACYclovir (VALTREX) 500 MG tablet TAKE 1 TABLET BY MOUTH EVERY DAY suppression, TWICE DAILY FOR 3 TO 7 DAYS WITH OUTBREAK 180 tablet 3   No current facility-administered medications on file prior to visit.     ROS see history of present illness  Objective  Physical Exam There were no vitals filed for this visit.  BP Readings from Last 3 Encounters:  03/19/23 130/64  02/27/23 121/76  10/08/22 118/70   Wt Readings from Last 3 Encounters:  03/19/23 181 lb (82.1 kg)  10/08/22 182 lb 9.6 oz (82.8 kg)  11/04/21 173 lb 12.8 oz (78.8 kg)    Physical Exam   Assessment/Plan: Please see individual problem list.  There are no diagnoses linked to this encounter.   Health Maintenance: ***  No follow-ups on file.   Bethanie Dicker, NP-C [facility] -  [employer]

## 2023-04-14 ENCOUNTER — Ambulatory Visit (INDEPENDENT_AMBULATORY_CARE_PROVIDER_SITE_OTHER): Payer: BC Managed Care – PPO | Admitting: Nurse Practitioner

## 2023-04-14 ENCOUNTER — Encounter: Payer: Self-pay | Admitting: Nurse Practitioner

## 2023-04-14 VITALS — BP 126/80 | HR 57 | Temp 98.7°F | Ht 64.0 in | Wt 183.0 lb

## 2023-04-14 DIAGNOSIS — F419 Anxiety disorder, unspecified: Secondary | ICD-10-CM | POA: Diagnosis not present

## 2023-04-14 DIAGNOSIS — E538 Deficiency of other specified B group vitamins: Secondary | ICD-10-CM | POA: Diagnosis not present

## 2023-04-14 DIAGNOSIS — E890 Postprocedural hypothyroidism: Secondary | ICD-10-CM

## 2023-04-14 DIAGNOSIS — Z23 Encounter for immunization: Secondary | ICD-10-CM

## 2023-04-14 DIAGNOSIS — F339 Major depressive disorder, recurrent, unspecified: Secondary | ICD-10-CM

## 2023-04-14 DIAGNOSIS — Z0000 Encounter for general adult medical examination without abnormal findings: Secondary | ICD-10-CM | POA: Diagnosis not present

## 2023-04-14 MED ORDER — SERTRALINE HCL 50 MG PO TABS
50.0000 mg | ORAL_TABLET | Freq: Every day | ORAL | 0 refills | Status: DC
Start: 2023-04-14 — End: 2023-06-15

## 2023-04-14 NOTE — Assessment & Plan Note (Signed)
Chronic issue. TSH noted to be low in recent lab work. Results faxed to Endocrinology. She has since had it rechecked and medication adjustment made. Follow up with Endo as scheduled.

## 2023-04-14 NOTE — Assessment & Plan Note (Signed)
GAD- 15 today. She does have Hydroxyzine at home to take as needed. Starting patient on Zoloft 50 mg daily. She will continue seeing counseling through work and looking for a new job. She will follow up in 4 weeks, sooner PRN.

## 2023-04-14 NOTE — Assessment & Plan Note (Signed)
Chronic issue. B12 noted to be elevated in recent lab work. Patient had been taking 2 tablets daily of her OTC supplement due to increased fatigue, she has since reduced to one tablet daily. Advised to only take one tablet daily. Will plan to recheck B12 level in the future to monitor.

## 2023-04-14 NOTE — Assessment & Plan Note (Signed)
PHQ- 19 today. Denies SI/HI. Will start patient on Zoloft 50 mg daily. Counseled patient on common side effects. Encouraged to contact if worsening symptoms, unusual behavior changes or suicidal thoughts occur. She will continue to see counseling and looking for a new job. She will follow up in 4 weeks, sooner PRN.

## 2023-04-14 NOTE — Assessment & Plan Note (Signed)
Physical exam complete. Lab results reviewed with patient. Pap- UTD. Colonoscopy- UTD. Mammogram- UTD. Flu vaccine- UTD. Tetanus vaccine- UTD. Shingles vaccine- due, 1st dose given today in office. She will return in 2-6 months for the 2nd dose. Declined additional COVID vaccines. HIV and Hep C screenings- negative. Recommended follow ups with Dentist and Ophthalmology for annual exams. Encouraged to work on [employer] and begin exercising. Return to care in one month, sooner PRN.

## 2023-05-12 ENCOUNTER — Ambulatory Visit: Payer: BC Managed Care – PPO | Admitting: Nurse Practitioner

## 2023-05-18 ENCOUNTER — Encounter: Payer: Self-pay | Admitting: Nurse Practitioner

## 2023-05-18 ENCOUNTER — Ambulatory Visit: Payer: BC Managed Care – PPO | Admitting: Nurse Practitioner

## 2023-05-18 VITALS — BP 112/66 | HR 71 | Temp 98.4°F | Ht 64.0 in | Wt 186.8 lb

## 2023-05-18 DIAGNOSIS — F339 Major depressive disorder, recurrent, unspecified: Secondary | ICD-10-CM | POA: Diagnosis not present

## 2023-05-18 DIAGNOSIS — F419 Anxiety disorder, unspecified: Secondary | ICD-10-CM

## 2023-05-18 NOTE — Assessment & Plan Note (Addendum)
Improvement in symptoms since starting on Zoloft. She will continue 50 mg daily. PHQ- 13 today. Denies SI/HI. Discussed increasing to 75 mg daily, she will contact when she is ready to. She will continue working with her counselor and looking for a new job. Encouraged to contact if worsening symptoms, unusual behavior changes or suicidal thoughts occur. She will follow up in 4 weeks, sooner PRN.

## 2023-05-18 NOTE — Progress Notes (Signed)
Bethanie Dicker, NP-C Phone: [phone]  Courtney Ward is a 57 y.o. female who presents today for follow up.   Anxiety/Depression- Patient started on Zoloft 50 mg daily last month. She has been doing well since starting on the medication. She is feeling better. She reports feeling calmer. She is sleeping better. She does feel that her mood and depression could still improve, however she would like to stay on her current dose for a little bit longer. She continues to have stressors at work and is actively looking for a new job. She has had multiple interviews. 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 PHQ- 13 and GAD- 11 today. Denies SI/HI.   Social History   Tobacco Use  Smoking Status Former  Smokeless Tobacco Never  Tobacco Comments   from age 8 to 17 3-4 cig maternal side lung cancer     Current Outpatient Medications on File Prior to Visit  Medication Sig Dispense Refill   Calcium Carbonate (CALCIUM-CARB 600 PO) Take 2 tablets by mouth daily.     Cholecalciferol (VITAMIN D3 GUMMIES PO) Take 5,000 Int'l Units by mouth.     cyanocobalamin (VITAMIN B12) 1000 MCG tablet Take 1,000 mcg by mouth daily.     famotidine (PEPCID) 40 MG tablet Take 1 tablet (40 mg total) by mouth at bedtime. 90 tablet 3   fluticasone (FLONASE) 50 MCG/ACT nasal spray SHAKE LIQUID AND USE 2 SPRAYS IN EACH NOSTRIL DAILY AS NEEDED FOR ALLERGIES OR RHINITIS 16 g 11   folic acid (FOLVITE) 1 MG tablet 1 tablet     hydroxychloroquine (PLAQUENIL) 200 MG tablet Take 200 mg by mouth 2 (two) times daily.      hydrOXYzine (VISTARIL) 25 MG capsule Take 1 capsule (25 mg total) by mouth every 8 (eight) hours as needed. 30 capsule 0   ibuprofen (ADVIL) 600 MG tablet Take 1 tablet (600 mg total) by mouth every 6 (six) hours as needed. 30 tablet 0   levothyroxine (SYNTHROID) 88 MCG tablet Take 1 tablet (88 mcg total) by mouth daily before breakfast. 90 tablet 3   lidocaine (LIDODERM)  5 % Place 1 patch onto the skin daily. Remove & Discard patch within 12 hours or as directed by MD 15 patch 0   liothyronine (CYTOMEL) 25 MCG tablet 1/2 daily.     methocarbamol (ROBAXIN) 500 MG tablet Take 1 tablet (500 mg total) by mouth every 6 (six) hours as needed for muscle spasms. 30 tablet 0   sertraline (ZOLOFT) 50 MG tablet Take 1 tablet (50 mg total) by mouth at bedtime. 90 tablet 0   valACYclovir (VALTREX) 500 MG tablet TAKE 1 TABLET BY MOUTH EVERY DAY suppression, TWICE DAILY FOR 3 TO 7 DAYS WITH OUTBREAK 180 tablet 3   No current facility-administered medications on file prior to visit.    ROS see history of present illness  Objective  Physical Exam Vitals:   05/18/23 1520  BP: 112/66  Pulse: 71  Temp: 98.4 F (36.9 C)  SpO2: 97%    BP Readings from Last 3 Encounters:  05/18/23 112/66  04/14/23 126/80  03/19/23 130/64   Wt Readings from Last 3 Encounters:  05/18/23 186 lb 12.8 oz (84.7 kg)  04/14/23 183 lb (83 kg)  03/19/23 181 lb (82.1 kg)    Physical Exam Constitutional:      General: She is not in acute distress.    Appearance: Normal appearance.  HENT:     Head: Normocephalic.  Cardiovascular:     Rate and Rhythm: Normal rate and regular rhythm.     Heart sounds: Normal heart sounds.  Pulmonary:     Effort: Pulmonary effort is normal.     Breath sounds: Normal breath sounds.  Skin:    General: Skin is warm and dry.  Neurological:     General: No focal deficit present.     Mental Status: She is alert.  Psychiatric:        Mood and Affect: Mood normal.        Behavior: Behavior normal.      Assessment/Plan: Please see individual problem list.  Depression, recurrent (HCC) Assessment & Plan: Improvement in symptoms since starting on Zoloft. She will continue 50 mg daily. PHQ- 13 today. Denies SI/HI. Discussed increasing to 75 mg daily, she will contact when she is ready to. She will continue working with her counselor and looking for a new  job. Encouraged to contact if worsening symptoms, unusual behavior changes or suicidal thoughts occur. She will follow up in 4 weeks, sooner PRN.    Anxiety Assessment & Plan: GAD- 11 today. She will continue Zoloft 50 mg daily. She has Hydroxyzine at home to take as needed. She will continue seeing counseling through work and looking for a new job. She will follow up in 4 weeks, sooner PRN. Will continue to monitor.     Return in about 4 weeks (around 06/15/2023) for Anxiety/Depression.   Bethanie Dicker, NP-C [facility] - [employer]

## 2023-05-18 NOTE — Assessment & Plan Note (Signed)
GAD- 11 today. She will continue Zoloft 50 mg daily. She has Hydroxyzine at home to take as needed. She will continue seeing counseling through work and looking for a new job. She will follow up in 4 weeks, sooner PRN. Will continue to monitor.

## 2023-06-15 ENCOUNTER — Ambulatory Visit: Payer: BC Managed Care – PPO | Admitting: Nurse Practitioner

## 2023-06-15 ENCOUNTER — Encounter: Payer: Self-pay | Admitting: Nurse Practitioner

## 2023-06-15 VITALS — BP 120/82 | HR 67 | Temp 98.6°F | Ht 64.0 in | Wt 185.6 lb

## 2023-06-15 DIAGNOSIS — Z23 Encounter for immunization: Secondary | ICD-10-CM | POA: Diagnosis not present

## 2023-06-15 DIAGNOSIS — F339 Major depressive disorder, recurrent, unspecified: Secondary | ICD-10-CM | POA: Diagnosis not present

## 2023-06-15 DIAGNOSIS — F419 Anxiety disorder, unspecified: Secondary | ICD-10-CM | POA: Diagnosis not present

## 2023-06-15 MED ORDER — SERTRALINE HCL 50 MG PO TABS
50.0000 mg | ORAL_TABLET | Freq: Every day | ORAL | 3 refills | Status: AC
Start: 2023-06-15 — End: ?

## 2023-06-15 NOTE — Progress Notes (Signed)
Bethanie Dicker, NP-C Phone: [phone]  Courtney Ward is a 57 y.o. female who presents today for follow up.   Anxiety/Depression- Patient on Zoloft 50 mg daily. She notes she has been doing very well. She was recently offered a new job in [place] that she is taking. She is very excited. Her son lives there along with her new grandchild. She will be moving next week with plans to start her new job on November 1st. 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 She had been actively looking for a new job. PHQ- 3 and GAD- 1 today. She has continued to see her counselor and she would like to continue her medication.  Social History   Tobacco Use  Smoking Status Former  Smokeless Tobacco Never  Tobacco Comments   from age 24 to 53 3-4 cig maternal side lung cancer     Current Outpatient Medications on File Prior to Visit  Medication Sig Dispense Refill   Calcium Carbonate (CALCIUM-CARB 600 PO) Take 2 tablets by mouth daily.     Cholecalciferol (VITAMIN D3 GUMMIES PO) Take 5,000 Int'l Units by mouth.     cyanocobalamin (VITAMIN B12) 1000 MCG tablet Take 1,000 mcg by mouth daily.     famotidine (PEPCID) 40 MG tablet Take 1 tablet (40 mg total) by mouth at bedtime. 90 tablet 3   fluticasone (FLONASE) 50 MCG/ACT nasal spray SHAKE LIQUID AND USE 2 SPRAYS IN EACH NOSTRIL DAILY AS NEEDED FOR ALLERGIES OR RHINITIS 16 g 11   folic acid (FOLVITE) 1 MG tablet 1 tablet     hydroxychloroquine (PLAQUENIL) 200 MG tablet Take 200 mg by mouth 2 (two) times daily.      hydrOXYzine (VISTARIL) 25 MG capsule Take 1 capsule (25 mg total) by mouth every 8 (eight) hours as needed. 30 capsule 0   ibuprofen (ADVIL) 600 MG tablet Take 1 tablet (600 mg total) by mouth every 6 (six) hours as needed. 30 tablet 0   levothyroxine (SYNTHROID) 88 MCG tablet Take 1 tablet (88 mcg total) by mouth daily before breakfast. 90 tablet 3   lidocaine (LIDODERM) 5 % Place  1 patch onto the skin daily. Remove & Discard patch within 12 hours or as directed by MD 15 patch 0   liothyronine (CYTOMEL) 25 MCG tablet 1/2 daily.     methocarbamol (ROBAXIN) 500 MG tablet Take 1 tablet (500 mg total) by mouth every 6 (six) hours as needed for muscle spasms. 30 tablet 0   valACYclovir (VALTREX) 500 MG tablet TAKE 1 TABLET BY MOUTH EVERY DAY suppression, TWICE DAILY FOR 3 TO 7 DAYS WITH OUTBREAK 180 tablet 3   No current facility-administered medications on file prior to visit.    ROS see history of present illness  Objective  Physical Exam Vitals:   06/15/23 0809  BP: 120/82  Pulse: 67  Temp: 98.6 F (37 C)  SpO2: 97%    BP Readings from Last 3 Encounters:  06/15/23 120/82  05/18/23 112/66  04/14/23 126/80   Wt Readings from Last 3 Encounters:  06/15/23 185 lb 9.6 oz (84.2 kg)  05/18/23 186 lb 12.8 oz (84.7 kg)  04/14/23 183 lb (83 kg)    Physical Exam Constitutional:      General: She is not in acute distress.    Appearance: Normal appearance.  HENT:     Head: Normocephalic.  Cardiovascular:     Rate and Rhythm: Normal rate and  regular rhythm.     Heart sounds: Normal heart sounds.  Pulmonary:     Effort: Pulmonary effort is normal.     Breath sounds: Normal breath sounds.  Skin:    General: Skin is warm and dry.  Neurological:     General: No focal deficit present.     Mental Status: She is alert.  Psychiatric:        Mood and Affect: Mood normal.        Behavior: Behavior normal.    Assessment/Plan: Please see individual problem list.  Depression, recurrent (HCC) Assessment & Plan: Significant improvement in symptoms since taking new job. PHQ- 3 today. She will continue her Zoloft 50 mg daily. Refills sent. Patient relocating next week. Encouraged to contact if worsening symptoms, unusual behavior changes or suicidal thoughts occur.   Orders: -     Sertraline HCl; Take 1 tablet (50 mg total) by mouth at bedtime.  Dispense: 90  tablet; Refill: 3  Anxiety Assessment & Plan: See plan for depression. GAD- 1 today.   Orders: -     Sertraline HCl; Take 1 tablet (50 mg total) by mouth at bedtime.  Dispense: 90 tablet; Refill: 3  Need for shingles vaccine -     Varicella-zoster vaccine IM   Return if symptoms worsen or fail to improve.   Bethanie Dicker, NP-C [facility] [employer]

## 2023-06-22 NOTE — Assessment & Plan Note (Signed)
See plan for depression. GAD- 1 today.

## 2023-06-22 NOTE — Assessment & Plan Note (Addendum)
Significant improvement in symptoms since taking new job. PHQ- 3 today. She will continue her Zoloft 50 mg daily. Refills sent. Patient relocating next week. Encouraged to contact if worsening symptoms, unusual behavior changes or suicidal thoughts occur.

## 2024-02-06 ENCOUNTER — Other Ambulatory Visit: Payer: Self-pay | Admitting: Nurse Practitioner

## 2024-02-06 DIAGNOSIS — K219 Gastro-esophageal reflux disease without esophagitis: Secondary | ICD-10-CM

## 2024-02-07 ENCOUNTER — Other Ambulatory Visit: Payer: Self-pay | Admitting: Nurse Practitioner

## 2024-02-07 DIAGNOSIS — K219 Gastro-esophageal reflux disease without esophagitis: Secondary | ICD-10-CM

## 2024-05-30 ENCOUNTER — Other Ambulatory Visit: Payer: Self-pay | Admitting: Nurse Practitioner

## 2024-05-30 DIAGNOSIS — K219 Gastro-esophageal reflux disease without esophagitis: Secondary | ICD-10-CM
